# Patient Record
Sex: Male | Born: 1965 | Race: White | Hispanic: No | Marital: Married | Smoking: Current every day smoker
Health system: Southern US, Community
[De-identification: ages and names within clinical notes are randomized; demographics above are authoritative.]

## PROBLEM LIST (undated history)

## (undated) DIAGNOSIS — G4700 Insomnia, unspecified: Secondary | ICD-10-CM

## (undated) DIAGNOSIS — F419 Anxiety disorder, unspecified: Secondary | ICD-10-CM

## (undated) DIAGNOSIS — S3609XA Other injury of spleen, initial encounter: Secondary | ICD-10-CM

## (undated) DIAGNOSIS — R739 Hyperglycemia, unspecified: Secondary | ICD-10-CM

## (undated) DIAGNOSIS — F1910 Other psychoactive substance abuse, uncomplicated: Secondary | ICD-10-CM

## (undated) DIAGNOSIS — H9193 Unspecified hearing loss, bilateral: Secondary | ICD-10-CM

## (undated) DIAGNOSIS — I639 Cerebral infarction, unspecified: Secondary | ICD-10-CM

## (undated) DIAGNOSIS — T8859XA Other complications of anesthesia, initial encounter: Secondary | ICD-10-CM

## (undated) DIAGNOSIS — Z9289 Personal history of other medical treatment: Secondary | ICD-10-CM

## (undated) DIAGNOSIS — T85611A Breakdown (mechanical) of intraperitoneal dialysis catheter, initial encounter: Secondary | ICD-10-CM

## (undated) DIAGNOSIS — I10 Essential (primary) hypertension: Secondary | ICD-10-CM

## (undated) DIAGNOSIS — I739 Peripheral vascular disease, unspecified: Secondary | ICD-10-CM

## (undated) DIAGNOSIS — N189 Chronic kidney disease, unspecified: Secondary | ICD-10-CM

## (undated) DIAGNOSIS — T4145XA Adverse effect of unspecified anesthetic, initial encounter: Secondary | ICD-10-CM

## (undated) HISTORY — PX: ILIAC ARTERY STENT: SHX1786

## (undated) HISTORY — PX: EYE SURGERY: SHX253

## (undated) HISTORY — PX: TIBIALIS TENDON TRANSFER / REPAIR: SHX6630

## (undated) HISTORY — DX: Peripheral vascular disease, unspecified: I73.9

## (undated) HISTORY — DX: Hyperglycemia, unspecified: R73.9

## (undated) HISTORY — DX: Other psychoactive substance abuse, uncomplicated: F19.10

## (undated) HISTORY — DX: Cerebral infarction, unspecified: I63.9

## (undated) HISTORY — PX: EXTERIORIZATION OF A CONTINUOUS AMBULATORY PERITONEAL DIALYSIS CATHETER: SHX6382

## (undated) HISTORY — PX: HEMORROIDECTOMY: SUR656

## (undated) HISTORY — DX: Anxiety disorder, unspecified: F41.9

## (undated) HISTORY — PX: CHOLECYSTECTOMY: SHX55

## (undated) HISTORY — PX: OTHER SURGICAL HISTORY: SHX169

## (undated) HISTORY — DX: Breakdown (mechanical) of intraperitoneal dialysis catheter, initial encounter: T85.611A

## (undated) HISTORY — DX: Other injury of spleen, initial encounter: S36.09XA

## (undated) HISTORY — DX: Unspecified hearing loss, bilateral: H91.93

## (undated) HISTORY — DX: Insomnia, unspecified: G47.00

## (undated) HISTORY — DX: Chronic kidney disease, unspecified: N18.9

---

## 2008-04-10 ENCOUNTER — Encounter: Admission: RE | Admit: 2008-04-10 | Discharge: 2008-04-10 | Payer: Self-pay | Admitting: Neurology

## 2008-05-03 ENCOUNTER — Ambulatory Visit (HOSPITAL_COMMUNITY): Admission: RE | Admit: 2008-05-03 | Discharge: 2008-05-03 | Payer: Self-pay | Admitting: Neurology

## 2008-05-07 ENCOUNTER — Ambulatory Visit (HOSPITAL_COMMUNITY): Admission: RE | Admit: 2008-05-07 | Discharge: 2008-05-07 | Payer: Self-pay | Admitting: Neurology

## 2008-05-09 ENCOUNTER — Ambulatory Visit: Payer: Self-pay | Admitting: Vascular Surgery

## 2008-05-09 ENCOUNTER — Encounter (INDEPENDENT_AMBULATORY_CARE_PROVIDER_SITE_OTHER): Payer: Self-pay | Admitting: Interventional Radiology

## 2008-05-09 ENCOUNTER — Ambulatory Visit (HOSPITAL_COMMUNITY): Admission: RE | Admit: 2008-05-09 | Discharge: 2008-05-09 | Payer: Self-pay | Admitting: Interventional Radiology

## 2008-05-23 ENCOUNTER — Encounter: Payer: Self-pay | Admitting: Neurology

## 2008-05-28 ENCOUNTER — Ambulatory Visit (HOSPITAL_COMMUNITY): Admission: RE | Admit: 2008-05-28 | Discharge: 2008-05-28 | Payer: Self-pay | Admitting: Neurology

## 2009-02-26 ENCOUNTER — Encounter: Payer: Self-pay | Admitting: Interventional Radiology

## 2009-03-22 ENCOUNTER — Ambulatory Visit (HOSPITAL_COMMUNITY): Admission: RE | Admit: 2009-03-22 | Discharge: 2009-03-22 | Payer: Self-pay | Admitting: Interventional Radiology

## 2010-03-18 ENCOUNTER — Ambulatory Visit (HOSPITAL_COMMUNITY): Admission: RE | Admit: 2010-03-18 | Discharge: 2010-03-18 | Payer: Self-pay | Admitting: Oral Surgery

## 2010-10-05 ENCOUNTER — Encounter: Payer: Self-pay | Admitting: Interventional Radiology

## 2010-10-06 ENCOUNTER — Encounter: Payer: Self-pay | Admitting: Interventional Radiology

## 2010-11-30 LAB — BASIC METABOLIC PANEL
Calcium: 9.7 mg/dL (ref 8.4–10.5)
Glucose, Bld: 90 mg/dL (ref 70–99)
Potassium: 5.1 mEq/L (ref 3.5–5.1)
Sodium: 138 mEq/L (ref 135–145)

## 2010-11-30 LAB — CBC
Hemoglobin: 15.5 g/dL (ref 13.0–17.0)
RBC: 4.6 MIL/uL (ref 4.22–5.81)
WBC: 9.3 10*3/uL (ref 4.0–10.5)

## 2010-12-21 LAB — CBC
HCT: 41.3 % (ref 39.0–52.0)
Hemoglobin: 14.6 g/dL (ref 13.0–17.0)
MCHC: 35.4 g/dL (ref 30.0–36.0)
MCV: 93.8 fL (ref 78.0–100.0)
RBC: 4.4 MIL/uL (ref 4.22–5.81)
RDW: 13.6 % (ref 11.5–15.5)
WBC: 9.7 10*3/uL (ref 4.0–10.5)

## 2010-12-21 LAB — BASIC METABOLIC PANEL
BUN: 33 mg/dL — ABNORMAL HIGH (ref 6–23)
CO2: 19 mEq/L (ref 19–32)
Calcium: 9 mg/dL (ref 8.4–10.5)
Chloride: 109 mEq/L (ref 96–112)
Creatinine, Ser: 2.03 mg/dL — ABNORMAL HIGH (ref 0.4–1.5)
Glucose, Bld: 99 mg/dL (ref 70–99)
Potassium: 3.7 mEq/L (ref 3.5–5.1)

## 2010-12-21 LAB — PROTIME-INR: Prothrombin Time: 14.2 seconds (ref 11.6–15.2)

## 2011-01-27 NOTE — Consult Note (Signed)
NAMEELDIN, BONSELL               ACCOUNT NO.:  [id number]   MEDICAL RECORD NO.:  [id number]          PATIENT TYPE:  OUT   LOCATION:  XRAY                         FACILITY:  [facility]   PHYSICIAN:  Sanjeev K. Deveshwar, M.D.DATE OF BIRTH:  1965/09/21   DATE OF CONSULTATION:  05/09/2008  DATE OF DISCHARGE:                                 CONSULTATION   BRIEF HISTORY:  Mr. Clayton Porter is a 45 year old male who recently had a  cerebral angiogram performed by Dr. Corliss Skains on May 07, 2008 at the  request of Dr. 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  Dr. 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  The patient was also noted to have lower  extremity disease.  An angiogram of the lower extremities was also  performed.  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  He had an angiographically occluded right common  iliac artery, right external iliac artery, and right internal iliac  artery.  There was reconstitution of the right common femoral iliac  artery from extensive musculoskeletal collaterals arising from the left  common femoral and superficial femoral arteries.  Dr. Corliss Skains felt  that the patient would need a stent assisted angioplasty of the left  external iliac artery.  It was recommended to the patient to start  aspirin 325 mg daily.   We were contacted by the patient's wife yesterday stating that the  patient was having pain in his groin.  The angiogram was performed from  the left groin.  We asked the patient to come in to be evaluated.  The  patient presented with his wife today.  We checked Dopplers of the lower  extremities and found the dorsalis pedis pulses and posterior tibialis  pulses to be intact bilaterally, although the right dorsalis pedis was  slightly weaker than the others.  The patient's main pain  appeared to be  in his left groin area.  There were no signs of ecchymosis or signs of  inflammation to indicate any type of infection.  We did send the patient  to the Vascular Lab for an ultrasound to rule out a pseudoaneurysm of  the femoral artery.  The preliminary report was that the ultrasound was  negative for any aneurysm or pseudoaneurysm.  The patient was sent home.   The patient is a smoker.  Dr. Corliss Skains again encouraged him to quit  smoking.  Apparently, the patient had not started the aspirin as  instructed by Dr. Corliss Skains.  Once again, Dr. Corliss Skains reiterated the  importance of daily aspirin therapy.  We will arrange further followup  for this patient.  There is no charge for this visit today.      Delton See, P.A.    ______________________________  [place]ville Silos. Corliss Skains, M.D.    DR/MEDQ  D:  05/09/2008  T:  05/10/2008  Job:  [id number]

## 2011-06-17 LAB — CBC
HCT: 42.8
RDW: 13.7

## 2011-06-17 LAB — BASIC METABOLIC PANEL
BUN: 20
CO2: 23
Chloride: 109
Glucose, Bld: 91

## 2011-06-17 LAB — PROTIME-INR
INR: 1
Prothrombin Time: 13.8

## 2016-04-28 HISTORY — PX: PERITONEAL CATHETER INSERTION: SHX2223

## 2016-05-08 HISTORY — PX: SPLENECTOMY: SUR1306

## 2016-07-27 ENCOUNTER — Encounter: Payer: Self-pay | Admitting: Vascular Surgery

## 2016-07-27 ENCOUNTER — Other Ambulatory Visit: Payer: Self-pay | Admitting: *Deleted

## 2016-07-27 DIAGNOSIS — Z01810 Encounter for preprocedural cardiovascular examination: Secondary | ICD-10-CM

## 2016-07-27 DIAGNOSIS — N186 End stage renal disease: Secondary | ICD-10-CM

## 2016-08-12 ENCOUNTER — Encounter: Payer: Self-pay | Admitting: Vascular Surgery

## 2016-08-19 ENCOUNTER — Other Ambulatory Visit (HOSPITAL_COMMUNITY): Payer: Self-pay

## 2016-08-19 ENCOUNTER — Encounter: Payer: Self-pay | Admitting: Vascular Surgery

## 2016-08-19 ENCOUNTER — Encounter (HOSPITAL_COMMUNITY): Payer: Self-pay

## 2016-09-04 ENCOUNTER — Encounter: Payer: Self-pay | Admitting: Vascular Surgery

## 2016-09-18 ENCOUNTER — Ambulatory Visit (INDEPENDENT_AMBULATORY_CARE_PROVIDER_SITE_OTHER)
Admission: RE | Admit: 2016-09-18 | Discharge: 2016-09-18 | Disposition: A | Discharge status: Home / Self Care | Payer: Medicare Other | Source: Ambulatory Visit | Attending: Vascular Surgery | Admitting: Vascular Surgery

## 2016-09-18 ENCOUNTER — Other Ambulatory Visit: Payer: Self-pay

## 2016-09-18 ENCOUNTER — Encounter: Payer: Self-pay | Admitting: Vascular Surgery

## 2016-09-18 ENCOUNTER — Ambulatory Visit (HOSPITAL_COMMUNITY)
Admission: RE | Admit: 2016-09-18 | Discharge: 2016-09-18 | Disposition: A | Discharge status: Home / Self Care | Payer: Medicare Other | Source: Ambulatory Visit | Attending: Vascular Surgery | Admitting: Vascular Surgery

## 2016-09-18 ENCOUNTER — Ambulatory Visit (INDEPENDENT_AMBULATORY_CARE_PROVIDER_SITE_OTHER): Payer: Medicare Other | Admitting: Vascular Surgery

## 2016-09-18 VITALS — BP 136/98 | HR 80 | Resp 14 | Ht 68.0 in | Wt 146.0 lb

## 2016-09-18 DIAGNOSIS — Z992 Dependence on renal dialysis: Secondary | ICD-10-CM

## 2016-09-18 DIAGNOSIS — R9439 Abnormal result of other cardiovascular function study: Secondary | ICD-10-CM | POA: Diagnosis not present

## 2016-09-18 DIAGNOSIS — Z01810 Encounter for preprocedural cardiovascular examination: Secondary | ICD-10-CM

## 2016-09-18 DIAGNOSIS — N186 End stage renal disease: Secondary | ICD-10-CM

## 2016-09-18 NOTE — H&P (Signed)
Patient ID: Clayton Porter, male   DOB: 09-Jan-1966, 51 y.o.   MRN: [id number]  Reason for Consult: New Evaluation (eval for perm access)   Referred by Edrick Oh, MD  Subjective:     HPI:  Clayton Porter is a 51 y.o. male history of end-stage renal disease previously dialyzed via  peritoneal dialysis but he had complications with that. He now dialyzes via right internal jugular catheter. He presents today for evaluation of permanent hemodialysis access. He does not have history of upper extremity surgery or central catheterization other than his recent [facility] placement. He is on a have a personal or family history of DVT. Currently dialyzes on Tuesdays Thursdays and Saturdays. He is right-hand dominant.  Past Medical History:  Diagnosis Date  . Anxiety   . Chronic kidney disease    [place]  Tuesday, Thursday and Saturday  . Hearing loss of both ears    Wears hearing aids in both eary  . Hyperglycemia   . Insomnia   . Peripheral vascular disease ([place])   . Peritoneal dialysis catheter dysfunction ([place])   . Splenic rupture   . Stroke ([place])   . Substance abuse    smoker   No family history on file. Past Surgical History:  Procedure Laterality Date  . CHOLECYSTECTOMY    . EXTERIORIZATION OF A CONTINUOUS AMBULATORY PERITONEAL DIALYSIS CATHETER     Multiple times   . EYE SURGERY Right    Cataract removal with IOL  . HEMORROIDECTOMY    . ILIAC ARTERY STENT Left   . PERITONEAL CATHETER INSERTION  04/28/2016   Dr. Maryjean Morn in [facility]   . TIBIALIS TENDON TRANSFER / REPAIR Right     Short Social History:  Social History  Substance Use Topics  . Smoking status: Current Every Day Smoker    Types: Cigarettes  . Smokeless tobacco: Never Used  . Alcohol use No    No Known Allergies  Current Outpatient Prescriptions  Medication Sig Dispense Refill  . aspirin 325 MG tablet Take 325 mg by mouth daily.    . calcium acetate (PHOSLO) 667 MG capsule Take 667 mg by  mouth 3 (three) times daily with meals.    . Cholecalciferol (VITAMIN D3) 1000 units CAPS Take by mouth.    . clopidogrel (PLAVIX) 75 MG tablet Take 75 mg by mouth daily.    . diphenhydramine-acetaminophen (TYLENOL PM) 25-500 MG TABS tablet Take 1 tablet by mouth at bedtime as needed.    Marland Kitchen losartan (COZAAR) 100 MG tablet Take 100 mg by mouth daily.    . rosuvastatin (CRESTOR) 20 MG tablet Take 20 mg by mouth daily.    . sennosides-docusate sodium (SENOKOT-S) 8.6-50 MG tablet Take 1 tablet by mouth daily.    Marland Kitchen topiramate (TOPAMAX) 25 MG tablet Take 75 mg by mouth at bedtime.     No current facility-administered medications for this visit.     Review of Systems  Constitutional:  Constitutional negative. Eyes: Positive for visual disturbance.   Respiratory: Respiratory negative.  Cardiovascular: Cardiovascular negative.  GI: Gastrointestinal negative.  Musculoskeletal: Musculoskeletal negative.  Neurological: Neurological negative. Hematologic: Hematologic/lymphatic negative.  Psychiatric: Psychiatric negative.        Objective:  Objective   Vitals:   09/18/16 1439  BP: (!) 136/98  Pulse: 80  Resp: 14  SpO2: 100%  Weight: 146 lb (66.2 kg)  Height: '5\' 8"'$  (1.727 m)   Body mass index is 22.2 kg/m.  Physical Exam  Constitutional:  He is oriented to person, place, and time. He appears well-nourished.  HENT:  Head: Atraumatic.  Eyes: EOM are normal.  Neck: Normal range of motion.  Cardiovascular: Normal rate.   Pulses:      Radial pulses are 2+ on the left side.  Pulmonary/Chest: Effort normal.  Abdominal: Soft. He exhibits no mass.  Musculoskeletal: Normal range of motion. He exhibits no edema.  Neurological: He is oriented to person, place, and time.  Skin: Skin is warm and dry.  Psychiatric: He has a normal mood and affect. His behavior is normal. Judgment and thought content normal.    Data: Left brachial artery 0.45 cm with triphasic waveform. The radial artery at  the wrist 0.23 cm triphasic waveform.  Vein mapping demonstrates very little usable vein for fistula in the bilateral upper extremity.     Assessment/Plan:    51 year old male recently switched from peritoneal to hemodialysis currently on Tuesday Thursday Saturday schedule via right IJ tunneled dialysis catheter. He is now in need of permanent edema access. He does not have suitable vein and will need left upper extremity AV grafting.  We discussed risk benefits alternatives and he agrees to proceed. We will schedule him for this in the near future And he will Plavix for 5 days preoperatively.     Waynetta Sandy MD [facility]

## 2016-09-25 ENCOUNTER — Other Ambulatory Visit: Payer: Self-pay

## 2016-09-25 NOTE — Progress Notes (Signed)
I was unable to reach patient by phone.  I left  A message on voice mail.  I instructed the patient to arrive at [facility] entrance at 5:30 AM  , nothing to eat or drink after midnight.   I instructed the patient to take the following medications in the am with just enough water to get them down: Crestor if he takes it in am, Aspirin only if instructed by Dr Donzetta Matters. I asked patient to not wear any lotions, powders, cologne, jewelry, piercing, make-up or nail polish.  I asked the patient to call [phone], in the am if there were any questions or problems. I instructed patient that he needs a ride home and someone to stay with him for the first 24 hours after surgery.  I reminded patient that he was to have stopped Plavix 5 days prior to surgery.

## 2016-09-27 ENCOUNTER — Encounter (HOSPITAL_COMMUNITY): Payer: Self-pay | Admitting: Anesthesiology

## 2016-09-27 NOTE — Anesthesia Preprocedure Evaluation (Deleted)
Anesthesia Evaluation    Reviewed: Allergy & Precautions, Patient's Chart, lab work & pertinent test results  History of Anesthesia Complications Negative for: history of anesthetic complications  Airway        Dental   Pulmonary Current Smoker,           Cardiovascular + Peripheral Vascular Disease       Neuro/Psych Anxiety CVA    GI/Hepatic negative GI ROS, Neg liver ROS,   Endo/Other  negative endocrine ROS  Renal/GU ESRF and DialysisRenal disease     Musculoskeletal negative musculoskeletal ROS (+)   Abdominal   Peds  Hematology negative hematology ROS (+)   Anesthesia Other Findings Day of surgery medications reviewed with the patient.  Reproductive/Obstetrics                             Anesthesia Physical Anesthesia Plan  ASA: III  Anesthesia Plan: MAC   Post-op Pain Management:    Induction:   Airway Management Planned: Natural Airway and Simple Face Mask  Additional Equipment:   Intra-op Plan:   Post-operative Plan:   Informed Consent:   Plan Discussed with:   Anesthesia Plan Comments:         Anesthesia Quick Evaluation

## 2016-09-28 ENCOUNTER — Ambulatory Visit (HOSPITAL_COMMUNITY): Admission: RE | Admit: 2016-09-28 | Payer: Medicare Other | Source: Ambulatory Visit | Admitting: Vascular Surgery

## 2016-09-28 SURGERY — INSERTION OF ARTERIOVENOUS (AV) GORE-TEX GRAFT ARM
Anesthesia: Monitor Anesthesia Care | Laterality: Left

## 2016-09-28 MED ORDER — DEXTROSE 5 % IV SOLN: 1.5000 g | INTRAVENOUS | Status: DC

## 2016-09-28 MED ORDER — CHLORHEXIDINE GLUCONATE CLOTH 2 % EX PADS: 6.0000 | MEDICATED_PAD | Freq: Once | CUTANEOUS | Status: DC

## 2016-09-28 MED ORDER — PROPOFOL 1000 MG/100ML IV EMUL
INTRAVENOUS | Status: AC
  Filled 2016-09-28: qty 100

## 2016-09-28 MED ORDER — SODIUM CHLORIDE 0.9 % IV SOLN
INTRAVENOUS | Status: DC
  Administered 2016-10-12: 09:00:00 via INTRAVENOUS

## 2016-10-08 ENCOUNTER — Encounter: Payer: Self-pay | Admitting: Vascular Surgery

## 2016-10-08 ENCOUNTER — Other Ambulatory Visit (HOSPITAL_COMMUNITY): Payer: Self-pay

## 2016-10-08 ENCOUNTER — Encounter (HOSPITAL_COMMUNITY): Payer: Self-pay

## 2016-10-09 ENCOUNTER — Encounter (HOSPITAL_COMMUNITY): Payer: Self-pay | Admitting: *Deleted

## 2016-10-09 NOTE — Progress Notes (Addendum)
Patient is out of Plavix, wife is not sure when he had last dose.

## 2016-10-12 ENCOUNTER — Ambulatory Visit (HOSPITAL_COMMUNITY)
Admission: RE | Admit: 2016-10-12 | Discharge: 2016-10-12 | Disposition: A | Discharge status: Home / Self Care | Payer: Medicare Other | Source: Ambulatory Visit | Attending: Vascular Surgery | Admitting: Vascular Surgery

## 2016-10-12 ENCOUNTER — Encounter (HOSPITAL_COMMUNITY): Payer: Self-pay | Admitting: *Deleted

## 2016-10-12 ENCOUNTER — Encounter (HOSPITAL_COMMUNITY)
Admission: RE | Disposition: A | Discharge status: Home / Self Care | Payer: Self-pay | Source: Ambulatory Visit | Attending: Vascular Surgery

## 2016-10-12 ENCOUNTER — Other Ambulatory Visit: Payer: Self-pay | Admitting: *Deleted

## 2016-10-12 ENCOUNTER — Ambulatory Visit (HOSPITAL_COMMUNITY): Payer: Medicare Other | Admitting: Certified Registered"

## 2016-10-12 ENCOUNTER — Other Ambulatory Visit: Payer: Self-pay

## 2016-10-12 DIAGNOSIS — Z992 Dependence on renal dialysis: Secondary | ICD-10-CM | POA: Insufficient documentation

## 2016-10-12 DIAGNOSIS — N186 End stage renal disease: Secondary | ICD-10-CM

## 2016-10-12 DIAGNOSIS — I120 Hypertensive chronic kidney disease with stage 5 chronic kidney disease or end stage renal disease: Secondary | ICD-10-CM | POA: Insufficient documentation

## 2016-10-12 DIAGNOSIS — F17210 Nicotine dependence, cigarettes, uncomplicated: Secondary | ICD-10-CM | POA: Diagnosis not present

## 2016-10-12 DIAGNOSIS — Z7902 Long term (current) use of antithrombotics/antiplatelets: Secondary | ICD-10-CM | POA: Diagnosis not present

## 2016-10-12 DIAGNOSIS — N185 Chronic kidney disease, stage 5: Secondary | ICD-10-CM | POA: Diagnosis not present

## 2016-10-12 DIAGNOSIS — Z4931 Encounter for adequacy testing for hemodialysis: Secondary | ICD-10-CM

## 2016-10-12 DIAGNOSIS — Z7982 Long term (current) use of aspirin: Secondary | ICD-10-CM | POA: Diagnosis not present

## 2016-10-12 DIAGNOSIS — Z79899 Other long term (current) drug therapy: Secondary | ICD-10-CM | POA: Insufficient documentation

## 2016-10-12 DIAGNOSIS — Z8673 Personal history of transient ischemic attack (TIA), and cerebral infarction without residual deficits: Secondary | ICD-10-CM | POA: Insufficient documentation

## 2016-10-12 HISTORY — DX: Personal history of other medical treatment: Z92.89

## 2016-10-12 HISTORY — DX: Other complications of anesthesia, initial encounter: T88.59XA

## 2016-10-12 HISTORY — PX: AV FISTULA PLACEMENT: SHX1204

## 2016-10-12 HISTORY — DX: Essential (primary) hypertension: I10

## 2016-10-12 HISTORY — DX: Adverse effect of unspecified anesthetic, initial encounter: T41.45XA

## 2016-10-12 LAB — POCT I-STAT 4, (NA,K, GLUC, HGB,HCT)
Glucose, Bld: 82 mg/dL (ref 65–99)
HEMATOCRIT: 32 % — AB (ref 39.0–52.0)
HEMOGLOBIN: 10.9 g/dL — AB (ref 13.0–17.0)
POTASSIUM: 5.8 mmol/L — AB (ref 3.5–5.1)
SODIUM: 139 mmol/L (ref 135–145)

## 2016-10-12 SURGERY — ARTERIOVENOUS (AV) FISTULA CREATION
Anesthesia: Monitor Anesthesia Care | Site: Arm Upper | Laterality: Left

## 2016-10-12 MED ORDER — 0.9 % SODIUM CHLORIDE (POUR BTL) OPTIME
TOPICAL | Status: DC | PRN
  Administered 2016-10-12: 1000 mL

## 2016-10-12 MED ORDER — PROMETHAZINE HCL 25 MG/ML IJ SOLN: 6.2500 mg | INTRAMUSCULAR | Status: DC | PRN

## 2016-10-12 MED ORDER — PHENYLEPHRINE HCL 10 MG/ML IJ SOLN
INTRAVENOUS | Status: DC | PRN
  Administered 2016-10-12: 40 ug/min via INTRAVENOUS

## 2016-10-12 MED ORDER — LIDOCAINE 2% (20 MG/ML) 5 ML SYRINGE
INTRAMUSCULAR | Status: AC
  Filled 2016-10-12: qty 5

## 2016-10-12 MED ORDER — SODIUM POLYSTYRENE SULFONATE 15 GM/60ML PO SUSP
30.0000 g | Freq: Once | ORAL | Status: DC
  Filled 2016-10-12: qty 120

## 2016-10-12 MED ORDER — OXYCODONE-ACETAMINOPHEN 5-325 MG PO TABS: 1.0000 | ORAL_TABLET | Freq: Four times a day (QID) | ORAL | 0 refills | Status: AC | PRN

## 2016-10-12 MED ORDER — CEFAZOLIN SODIUM-DEXTROSE 2-4 GM/100ML-% IV SOLN
INTRAVENOUS | Status: AC
  Filled 2016-10-12: qty 100

## 2016-10-12 MED ORDER — FENTANYL CITRATE (PF) 100 MCG/2ML IJ SOLN: 25.0000 ug | INTRAMUSCULAR | Status: DC | PRN

## 2016-10-12 MED ORDER — LIDOCAINE-EPINEPHRINE (PF) 1 %-1:200000 IJ SOLN
INTRAMUSCULAR | Status: DC | PRN
Start: 2016-10-12 — End: 2016-10-12
  Administered 2016-10-12: 30 mL

## 2016-10-12 MED ORDER — EPHEDRINE 5 MG/ML INJ
INTRAVENOUS | Status: AC
  Filled 2016-10-12: qty 10

## 2016-10-12 MED ORDER — MIDAZOLAM HCL 2 MG/2ML IJ SOLN
INTRAMUSCULAR | Status: AC
  Filled 2016-10-12: qty 2

## 2016-10-12 MED ORDER — LIDOCAINE 2% (20 MG/ML) 5 ML SYRINGE
INTRAMUSCULAR | Status: DC | PRN
  Administered 2016-10-12: 60 mg via INTRAVENOUS

## 2016-10-12 MED ORDER — PROPOFOL 500 MG/50ML IV EMUL
INTRAVENOUS | Status: DC | PRN
  Administered 2016-10-12: 75 ug/kg/min via INTRAVENOUS

## 2016-10-12 MED ORDER — FENTANYL CITRATE (PF) 100 MCG/2ML IJ SOLN
INTRAMUSCULAR | Status: DC | PRN
  Administered 2016-10-12 (×2): 50 ug via INTRAVENOUS

## 2016-10-12 MED ORDER — LIDOCAINE-EPINEPHRINE (PF) 1 %-1:200000 IJ SOLN
INTRAMUSCULAR | Status: AC
  Filled 2016-10-12: qty 30

## 2016-10-12 MED ORDER — PROPOFOL 10 MG/ML IV BOLUS
INTRAVENOUS | Status: AC
  Filled 2016-10-12: qty 20

## 2016-10-12 MED ORDER — CEFAZOLIN SODIUM-DEXTROSE 2-4 GM/100ML-% IV SOLN
2.0000 g | Freq: Once | INTRAVENOUS | Status: AC
  Administered 2016-10-12: 2 g via INTRAVENOUS

## 2016-10-12 MED ORDER — FENTANYL CITRATE (PF) 100 MCG/2ML IJ SOLN
INTRAMUSCULAR | Status: AC
  Filled 2016-10-12: qty 2

## 2016-10-12 MED ORDER — HEPARIN SODIUM (PORCINE) 5000 UNIT/ML IJ SOLN
INTRAMUSCULAR | Status: DC | PRN
  Administered 2016-10-12: 10:00:00

## 2016-10-12 MED ORDER — PHENYLEPHRINE 40 MCG/ML (10ML) SYRINGE FOR IV PUSH (FOR BLOOD PRESSURE SUPPORT)
PREFILLED_SYRINGE | INTRAVENOUS | Status: DC | PRN
  Administered 2016-10-12: 80 ug via INTRAVENOUS

## 2016-10-12 MED ORDER — MIDAZOLAM HCL 5 MG/5ML IJ SOLN
INTRAMUSCULAR | Status: DC | PRN
  Administered 2016-10-12: 2 mg via INTRAVENOUS

## 2016-10-12 SURGICAL SUPPLY — 35 items
ARMBAND PINK RESTRICT EXTREMIT (MISCELLANEOUS) ×6 IMPLANT
CANISTER SUCTION 2500CC (MISCELLANEOUS) ×3 IMPLANT
CLIP TI MEDIUM 6 (CLIP) ×3 IMPLANT
CLIP TI WIDE RED SMALL 6 (CLIP) ×3 IMPLANT
COVER PROBE W GEL 5X96 (DRAPES) ×3 IMPLANT
DERMABOND ADVANCED (GAUZE/BANDAGES/DRESSINGS) ×1
DERMABOND ADVANCED .7 DNX12 (GAUZE/BANDAGES/DRESSINGS) ×2 IMPLANT
ELECT REM PT RETURN 9FT ADLT (ELECTROSURGICAL) ×3
ELECTRODE REM PT RTRN 9FT ADLT (ELECTROSURGICAL) ×2 IMPLANT
GLOVE BIO SURGEON STRL SZ 6.5 (GLOVE) ×3 IMPLANT
GLOVE BIO SURGEON STRL SZ7.5 (GLOVE) ×3 IMPLANT
GLOVE BIO SURGEON STRL SZ8.5 (GLOVE) ×3 IMPLANT
GLOVE BIOGEL PI IND STRL 7.0 (GLOVE) ×4 IMPLANT
GLOVE BIOGEL PI INDICATOR 7.0 (GLOVE) ×2
GLOVE ECLIPSE 6.5 STRL STRAW (GLOVE) ×3 IMPLANT
GOWN STRL REUS W/ TWL LRG LVL3 (GOWN DISPOSABLE) ×4 IMPLANT
GOWN STRL REUS W/ TWL XL LVL3 (GOWN DISPOSABLE) ×2 IMPLANT
GOWN STRL REUS W/TWL LRG LVL3 (GOWN DISPOSABLE) ×2
GOWN STRL REUS W/TWL XL LVL3 (GOWN DISPOSABLE) ×1
HEMOSTAT SNOW SURGICEL 2X4 (HEMOSTASIS) IMPLANT
INSERT FOGARTY SM (MISCELLANEOUS) ×3 IMPLANT
KIT BASIN OR (CUSTOM PROCEDURE TRAY) ×3 IMPLANT
KIT ROOM TURNOVER OR (KITS) ×3 IMPLANT
NS IRRIG 1000ML POUR BTL (IV SOLUTION) ×3 IMPLANT
PACK CV ACCESS (CUSTOM PROCEDURE TRAY) ×3 IMPLANT
PAD ARMBOARD 7.5X6 YLW CONV (MISCELLANEOUS) ×6 IMPLANT
SUT GORETEX 6.0 TH-9 30 IN (SUTURE) IMPLANT
SUT GORETEX CV-6TTC-13 36IN (SUTURE) IMPLANT
SUT MNCRL AB 4-0 PS2 18 (SUTURE) IMPLANT
SUT PROLENE 6 0 BV (SUTURE) IMPLANT
SUT SILK 2 0 SH (SUTURE) IMPLANT
SUT VIC AB 3-0 SH 27 (SUTURE) ×2
SUT VIC AB 3-0 SH 27X BRD (SUTURE) ×4 IMPLANT
UNDERPAD 30X30 (UNDERPADS AND DIAPERS) ×3 IMPLANT
WATER STERILE IRR 1000ML POUR (IV SOLUTION) ×3 IMPLANT

## 2016-10-12 NOTE — Op Note (Signed)
    OPERATIVE NOTE   PROCEDURE: Left brachiocephalic av fistula  PRE-OPERATIVE DIAGNOSIS: esrd  POST-OPERATIVE DIAGNOSIS: same  SURGEON: Yolanda Dockendorf C. Donzetta Matters, MD  ASSISTANT: Virgina Jock, PA  ANESTHESIA: local and MAC  ESTIMATED BLOOD LOSS: 20 cc  FINDING(S): Cephalic vein measure 16m in mid upper arm, at completion there was palpable pulsatility in runoff vein and palpable radial pulse   INDICATIONS:   Clayton SCHOENECKis a 51y.o. male who presents with end-stage renal disease. He previously had peritoneal needle dialysis and now has converted to hemodialysis via a right IJ tunneled dialysis catheter. Vein mapping demonstrated no usable vein for an upper extremity fistula so we had planned AV grafting. However at time of procedure ultrasound demonstrated suitable vein in the cephalic and basilic in the upper arm on the left returns of the choices may proceed with upper extremity AV fistula procedure.  DESCRIPTION: Patient was taken the operating room placed supine on the operating table Mac anesthesia was induced he was sterilely prepped and draped in the upper extremity on the left in the usual fashion and timeout called. We his ultrasound and identified some veins in his upper arm namely his cephalic vein being 3 mm. With this the wife was contacted and we changed the plan from grafting to upper extremity AV fistula which I have also spoken with the patient with prior but had not added to the consent. With this we instilled 8 mL of 1% lidocaine with epinephrine just below the antecubitum and made a transverse incision. We dissected down to the cephalic vein identified this has having a large external diameter. The branch was clipped we then transected the vein distally on the arm and dilated up to 3 mm in instilled heparinized saline and clamped the vein. We turned our attention towards the artery then. The fascia was divided the brachial artery was identified and circled with vessel  loop. The vein was then spatulated and prepared for anastomosis. The artery was clamped proximally and distally opened longitudinally and flushed with heparinized saline. The vein was then sewn end-to-side with 6-0 Prolene suture. Prior to releasing our clamps we did allow flushing maneuvers. Upon completing our anastomosis there was a palpable thrill with pulsatility in the runoff vein and a palpable radial pulse in the left. Satisfied with hemostasis we irrigated closed in 2 layers with Vicryl and Monocryl. Dermabond was placed level skin. Patient was awakened from anesthesia having tolerated the procedure well without immediate complication   CONDITION: stable   Clayton Porter C. CDonzetta Matters MD [facility]Office: [phone]: [phone] 10/12/2016, 10:43 AM

## 2016-10-12 NOTE — Anesthesia Postprocedure Evaluation (Signed)
Anesthesia Post Note  Patient: Clayton Porter  Procedure(s) Performed: Procedure(s) (LRB): ARTERIOVENOUS (AV) FISTULA CREATION LEFT UPPER ARM (Left)  Patient location during evaluation: PACU Anesthesia Type: MAC Level of consciousness: awake and alert Pain management: pain level controlled Vital Signs Assessment: post-procedure vital signs reviewed and stable Respiratory status: spontaneous breathing, nonlabored ventilation, respiratory function stable and patient connected to nasal cannula oxygen Cardiovascular status: stable and blood pressure returned to baseline Anesthetic complications: no       Last Vitals:  Vitals:   10/12/16 1110 10/12/16 1134  BP: (!) 121/91 (!) 137/95  Pulse: 86 93  Resp: (!) 23 18  Temp: 36.7 C     Last Pain:  Vitals:   10/12/16 1134  TempSrc:   PainSc: 0-No pain                 Tiajuana Amass

## 2016-10-12 NOTE — Transfer of Care (Signed)
Immediate Anesthesia Transfer of Care Note  Patient: Clayton Porter  Procedure(s) Performed: Procedure(s): ARTERIOVENOUS (AV) FISTULA CREATION LEFT UPPER ARM (Left)  Patient Location: PACU  Anesthesia Type:MAC  Level of Consciousness: awake, alert , oriented and patient cooperative  Airway & Oxygen Therapy: Patient Spontanous Breathing and Patient connected to nasal cannula oxygen  Post-op Assessment: Report given to RN, Post -op Vital signs reviewed and stable and Patient moving all extremities  Post vital signs: Reviewed and stable  Last Vitals:  Vitals:   10/12/16 0854  BP: (!) 160/94  Pulse: 88  Resp: 18  Temp: 37.1 C    Last Pain:  Vitals:   10/12/16 0854  TempSrc: Oral      Patients Stated Pain Goal: 5 (34/19/37 9024)  Complications: No apparent anesthesia complications

## 2016-10-12 NOTE — H&P (Signed)
     History and Physical Update  The patient was interviewed and re-examined.  The patient's previous History and Physical has been reviewed and is unchanged from office visit. Plan left arm av graft today.  Jaimey Franchini C. Donzetta Matters, MD [facility] [place] Office: [phone] Pager: [phone]   10/12/2016, 8:44 AM

## 2016-10-12 NOTE — Anesthesia Preprocedure Evaluation (Signed)
Anesthesia Evaluation  Patient identified by MRN, date of birth, ID band Patient awake    Reviewed: Allergy & Precautions, NPO status , Patient's Chart, lab work & pertinent test results  Airway Mallampati: II  TM Distance: >3 FB Neck ROM: Full    Dental  (+) Dental Advisory Given   Pulmonary Current Smoker,    breath sounds clear to auscultation       Cardiovascular hypertension, Pt. on medications  Rhythm:Regular Rate:Normal     Neuro/Psych CVA    GI/Hepatic negative GI ROS, Neg liver ROS,   Endo/Other  negative endocrine ROS  Renal/GU ESRFRenal disease     Musculoskeletal   Abdominal   Peds  Hematology negative hematology ROS (+)   Anesthesia Other Findings   Reproductive/Obstetrics                              Lab Results  Component Value Date   WBC 9.3 03/13/2010   HGB 15.5 03/13/2010   HCT 44.7 03/13/2010   MCV 97.1 03/13/2010   PLT 212 03/13/2010   Lab Results  Component Value Date   CREATININE 2.30 (H) 03/13/2010   BUN 26 (H) 03/13/2010   NA 138 03/13/2010   K 5.1 03/13/2010   CL 107 03/13/2010   CO2 22 03/13/2010    Anesthesia Physical Anesthesia Plan  ASA: III  Anesthesia Plan: MAC   Post-op Pain Management:    Induction: Intravenous  Airway Management Planned: Natural Airway and Simple Face Mask  Additional Equipment:   Intra-op Plan:   Post-operative Plan:   Informed Consent: I have reviewed the patients History and Physical, chart, labs and discussed the procedure including the risks, benefits and alternatives for the proposed anesthesia with the patient or authorized representative who has indicated his/her understanding and acceptance.   Dental advisory given  Plan Discussed with:   Anesthesia Plan Comments:         Anesthesia Quick Evaluation

## 2016-10-12 NOTE — OR Nursing (Signed)
Renal discipline notified of admitting potassium level. Orders recvd.

## 2016-10-13 ENCOUNTER — Telehealth: Payer: Self-pay

## 2016-10-13 ENCOUNTER — Encounter (HOSPITAL_COMMUNITY): Payer: Self-pay | Admitting: Vascular Surgery

## 2016-10-13 NOTE — Telephone Encounter (Signed)
Pt's wife called.  Reported the pt's left arm is swollen in incisional area, and around his elbow.  Stated "there is a little redness, but no warmth."  Stated he has been taking Percocet 5/325 mg 1 tab, for pain, and it isn't helping.  Discussed pt's symptoms with him; denied any pain or numbness of left hand or fingers.  Stated the arm is so painful around the elbow, it's hard to move it around.  Advised the swelling is not unusual.  Encouraged to elevate the arm on pillows, to position above level of heart, to manage the swelling.  The pt. stated he hasn't taken any Percocet today, because it didn't help.  Discussed with Dr. Donnetta Hutching.  Recommended to try taking 2 tablets of the Percocet 5/325 mg., to see if that controls the pain more.  The pt. stated he has 4 tabs left.  He will try taking 2 tablets per dose, and will call tomorrow to update on how he is doing.

## 2016-11-19 ENCOUNTER — Other Ambulatory Visit: Payer: Self-pay | Admitting: *Deleted

## 2016-11-20 ENCOUNTER — Encounter: Payer: Self-pay | Admitting: Vascular Surgery

## 2016-11-27 ENCOUNTER — Ambulatory Visit (INDEPENDENT_AMBULATORY_CARE_PROVIDER_SITE_OTHER): Payer: Self-pay | Admitting: Vascular Surgery

## 2016-11-27 ENCOUNTER — Encounter: Payer: Self-pay | Admitting: Vascular Surgery

## 2016-11-27 ENCOUNTER — Ambulatory Visit (HOSPITAL_COMMUNITY)
Admission: RE | Admit: 2016-11-27 | Discharge: 2016-11-27 | Disposition: A | Discharge status: Home / Self Care | Payer: Medicare Other | Source: Ambulatory Visit | Attending: Vascular Surgery | Admitting: Vascular Surgery

## 2016-11-27 VITALS — BP 134/81 | HR 99 | Temp 97.0°F | Resp 16 | Ht 68.0 in | Wt 151.0 lb

## 2016-11-27 DIAGNOSIS — N186 End stage renal disease: Secondary | ICD-10-CM

## 2016-11-27 DIAGNOSIS — Z992 Dependence on renal dialysis: Secondary | ICD-10-CM

## 2016-11-27 DIAGNOSIS — Z4931 Encounter for adequacy testing for hemodialysis: Secondary | ICD-10-CM | POA: Insufficient documentation

## 2016-11-27 NOTE — Progress Notes (Signed)
Subjective:     Patient ID: Clayton Porter, male   DOB: Dec 23, 1965, 51 y.o.   MRN: [id number]  HPI 51 year old male returns follow-up from left brachiocephalic AV fistula placement. He is doing well without any complaints. He is on dialysis via internal jugular catheter on the right. He has no complaints related to today's visit.  Review of Systems On hd via R IJ catheter    Objective:   Physical Exam aaox3 Left arm with palpable radial pulse, strong thrill in avf  Duplex of left arm fistula today demonstrates adequate size and depth. The flow volume is 1740 mL/min. There is one large branch vessel in the distal upper arm and one area of elevated velocities due to retained valve leaflet.     Assessment/plan     51 year old male returns for follow-up of left brachiocephalic AV fistula with strong thrill left hand is doing well with palpable radial pulse. His large diameter fistula although there is one large branch. He has good to start using this at 12 weeks from the time of placement. After then used for 3 times we can remove his catheter. He can otherwise follow up on a when necessary basis.  Klarissa Mcilvain C. Donzetta Matters, MD [facility] [place] Office: [phone] Pager: [phone]

## 2017-04-17 ENCOUNTER — Emergency Department (HOSPITAL_COMMUNITY): Payer: Medicare Other

## 2017-04-17 ENCOUNTER — Inpatient Hospital Stay (HOSPITAL_COMMUNITY)
Admission: EM | Admit: 2017-04-17 | Discharge: 2017-04-18 | DRG: 871 | Discharge status: Against Medical Advice | Payer: Medicare Other | Attending: Family Medicine | Admitting: Family Medicine

## 2017-04-17 ENCOUNTER — Encounter (HOSPITAL_COMMUNITY): Payer: Self-pay | Admitting: *Deleted

## 2017-04-17 DIAGNOSIS — Z992 Dependence on renal dialysis: Secondary | ICD-10-CM

## 2017-04-17 DIAGNOSIS — I120 Hypertensive chronic kidney disease with stage 5 chronic kidney disease or end stage renal disease: Secondary | ICD-10-CM | POA: Diagnosis present

## 2017-04-17 DIAGNOSIS — R918 Other nonspecific abnormal finding of lung field: Secondary | ICD-10-CM | POA: Diagnosis present

## 2017-04-17 DIAGNOSIS — Z79899 Other long term (current) drug therapy: Secondary | ICD-10-CM

## 2017-04-17 DIAGNOSIS — Z7902 Long term (current) use of antithrombotics/antiplatelets: Secondary | ICD-10-CM

## 2017-04-17 DIAGNOSIS — D72829 Elevated white blood cell count, unspecified: Secondary | ICD-10-CM | POA: Diagnosis present

## 2017-04-17 DIAGNOSIS — Z961 Presence of intraocular lens: Secondary | ICD-10-CM | POA: Diagnosis present

## 2017-04-17 DIAGNOSIS — K920 Hematemesis: Secondary | ICD-10-CM | POA: Diagnosis present

## 2017-04-17 DIAGNOSIS — N186 End stage renal disease: Secondary | ICD-10-CM | POA: Diagnosis present

## 2017-04-17 DIAGNOSIS — I10 Essential (primary) hypertension: Secondary | ICD-10-CM

## 2017-04-17 DIAGNOSIS — I739 Peripheral vascular disease, unspecified: Secondary | ICD-10-CM | POA: Diagnosis present

## 2017-04-17 DIAGNOSIS — Z974 Presence of external hearing-aid: Secondary | ICD-10-CM | POA: Diagnosis not present

## 2017-04-17 DIAGNOSIS — Y95 Nosocomial condition: Secondary | ICD-10-CM | POA: Diagnosis present

## 2017-04-17 DIAGNOSIS — F17210 Nicotine dependence, cigarettes, uncomplicated: Secondary | ICD-10-CM | POA: Diagnosis present

## 2017-04-17 DIAGNOSIS — R042 Hemoptysis: Secondary | ICD-10-CM | POA: Diagnosis present

## 2017-04-17 DIAGNOSIS — Z9049 Acquired absence of other specified parts of digestive tract: Secondary | ICD-10-CM

## 2017-04-17 DIAGNOSIS — A419 Sepsis, unspecified organism: Principal | ICD-10-CM | POA: Diagnosis present

## 2017-04-17 DIAGNOSIS — Z95828 Presence of other vascular implants and grafts: Secondary | ICD-10-CM | POA: Diagnosis not present

## 2017-04-17 DIAGNOSIS — E876 Hypokalemia: Secondary | ICD-10-CM | POA: Diagnosis present

## 2017-04-17 DIAGNOSIS — Z7982 Long term (current) use of aspirin: Secondary | ICD-10-CM

## 2017-04-17 DIAGNOSIS — D72825 Bandemia: Secondary | ICD-10-CM

## 2017-04-17 DIAGNOSIS — H9193 Unspecified hearing loss, bilateral: Secondary | ICD-10-CM | POA: Diagnosis present

## 2017-04-17 DIAGNOSIS — Z8673 Personal history of transient ischemic attack (TIA), and cerebral infarction without residual deficits: Secondary | ICD-10-CM | POA: Diagnosis not present

## 2017-04-17 DIAGNOSIS — Z9081 Acquired absence of spleen: Secondary | ICD-10-CM

## 2017-04-17 DIAGNOSIS — F419 Anxiety disorder, unspecified: Secondary | ICD-10-CM | POA: Diagnosis present

## 2017-04-17 DIAGNOSIS — R64 Cachexia: Secondary | ICD-10-CM | POA: Diagnosis present

## 2017-04-17 DIAGNOSIS — J189 Pneumonia, unspecified organism: Secondary | ICD-10-CM | POA: Diagnosis present

## 2017-04-17 DIAGNOSIS — D649 Anemia, unspecified: Secondary | ICD-10-CM | POA: Diagnosis not present

## 2017-04-17 DIAGNOSIS — Z6821 Body mass index (BMI) 21.0-21.9, adult: Secondary | ICD-10-CM

## 2017-04-17 LAB — CBC WITH DIFFERENTIAL/PLATELET
BASOS ABS: 0 10*3/uL (ref 0.0–0.1)
BASOS PCT: 0 %
EOS ABS: 0.4 10*3/uL (ref 0.0–0.7)
Eosinophils Relative: 2 %
HCT: 26.6 % — ABNORMAL LOW (ref 39.0–52.0)
Hemoglobin: 8.8 g/dL — ABNORMAL LOW (ref 13.0–17.0)
LYMPHS PCT: 16 %
Lymphs Abs: 3 10*3/uL (ref 0.7–4.0)
MCH: 31.8 pg (ref 26.0–34.0)
MCHC: 33.1 g/dL (ref 30.0–36.0)
MCV: 96 fL (ref 78.0–100.0)
Monocytes Absolute: 2.2 10*3/uL — ABNORMAL HIGH (ref 0.1–1.0)
Monocytes Relative: 12 %
NEUTROS PCT: 70 %
Neutro Abs: 13 10*3/uL — ABNORMAL HIGH (ref 1.7–7.7)
PLATELETS: 655 10*3/uL — AB (ref 150–400)
RBC: 2.77 MIL/uL — AB (ref 4.22–5.81)
RDW: 16 % — ABNORMAL HIGH (ref 11.5–15.5)
WBC: 18.6 10*3/uL — AB (ref 4.0–10.5)

## 2017-04-17 LAB — COMPREHENSIVE METABOLIC PANEL
ALBUMIN: 2.8 g/dL — AB (ref 3.5–5.0)
ALT: 18 U/L (ref 17–63)
ANION GAP: 13 (ref 5–15)
AST: 27 U/L (ref 15–41)
Alkaline Phosphatase: 93 U/L (ref 38–126)
BUN: 10 mg/dL (ref 6–20)
CHLORIDE: 90 mmol/L — AB (ref 101–111)
CO2: 30 mmol/L (ref 22–32)
Calcium: 8.4 mg/dL — ABNORMAL LOW (ref 8.9–10.3)
Creatinine, Ser: 2.78 mg/dL — ABNORMAL HIGH (ref 0.61–1.24)
GFR calc Af Amer: 29 mL/min — ABNORMAL LOW (ref >60)
GFR calc non Af Amer: 25 mL/min — ABNORMAL LOW (ref >60)
Glucose, Bld: 133 mg/dL — ABNORMAL HIGH (ref 65–99)
POTASSIUM: 3.4 mmol/L — AB (ref 3.5–5.1)
Sodium: 133 mmol/L — ABNORMAL LOW (ref 135–145)
Total Bilirubin: 0.5 mg/dL (ref 0.3–1.2)
Total Protein: 7.3 g/dL (ref 6.5–8.1)

## 2017-04-17 LAB — TYPE AND SCREEN
ABO/RH(D): O POS
ANTIBODY SCREEN: NEGATIVE

## 2017-04-17 LAB — PROTIME-INR
INR: 1.01
PROTHROMBIN TIME: 13.3 s (ref 11.4–15.2)

## 2017-04-17 LAB — URINALYSIS, MICROSCOPIC (REFLEX)
Bacteria, UA: NONE SEEN
Squamous Epithelial / LPF: NONE SEEN

## 2017-04-17 LAB — URINALYSIS, ROUTINE W REFLEX MICROSCOPIC
BILIRUBIN URINE: NEGATIVE
Glucose, UA: NEGATIVE mg/dL
KETONES UR: NEGATIVE mg/dL
LEUKOCYTES UA: NEGATIVE
NITRITE: NEGATIVE
PROTEIN: 30 mg/dL — AB
Specific Gravity, Urine: 1.01 (ref 1.005–1.030)
pH: 8 (ref 5.0–8.0)

## 2017-04-17 LAB — I-STAT CG4 LACTIC ACID, ED
LACTIC ACID, VENOUS: 2.67 mmol/L — AB (ref 0.5–1.9)
LACTIC ACID, VENOUS: 2.31 mmol/L — AB (ref 0.5–1.9)

## 2017-04-17 MED ORDER — CEFEPIME HCL 2 G IJ SOLR
2.0000 g | Freq: Once | INTRAMUSCULAR | Status: AC
  Administered 2017-04-17: 2 g via INTRAVENOUS
  Filled 2017-04-17: qty 2

## 2017-04-17 MED ORDER — MORPHINE SULFATE (PF) 4 MG/ML IV SOLN
4.0000 mg | Freq: Once | INTRAVENOUS | Status: AC
Start: 2017-04-17 — End: 2017-04-17
  Administered 2017-04-17: 4 mg via INTRAVENOUS
  Filled 2017-04-17: qty 1

## 2017-04-17 MED ORDER — SODIUM CHLORIDE 0.9 % IV BOLUS (SEPSIS)
1000.0000 mL | Freq: Once | INTRAVENOUS | Status: AC
  Administered 2017-04-17: 1000 mL via INTRAVENOUS

## 2017-04-17 MED ORDER — VANCOMYCIN HCL 10 G IV SOLR
1500.0000 mg | Freq: Once | INTRAVENOUS | Status: AC
  Administered 2017-04-17: 1500 mg via INTRAVENOUS
  Filled 2017-04-17: qty 1500

## 2017-04-17 MED ORDER — SODIUM CHLORIDE 0.9 % IV SOLN
80.0000 mg | Freq: Once | INTRAVENOUS | Status: DC
  Filled 2017-04-17: qty 80

## 2017-04-17 NOTE — ED Notes (Signed)
Pt request food and drink. MD notified and orders received

## 2017-04-17 NOTE — ED Notes (Signed)
Patient transported to CT 

## 2017-04-17 NOTE — ED Notes (Signed)
Consulting MD at bedside [facility]).

## 2017-04-17 NOTE — ED Triage Notes (Signed)
Pt reports coughing up blood for several days. Having abd pain and reports vomiting blood last night. Dialysis pt, last treatment was today.

## 2017-04-17 NOTE — H&P (Signed)
[name] ATF:[id number] DOB: 30-Jan-1966 DOA: 04/17/2017     PCP: Harvie Junior, MD   Outpatient Specialists: Nephrology   Patient coming from:   home Lives With family    Chief Complaint: Coughing up blood  HPI: Clayton Porter is a 51 y.o. male with medical history significant of ESRD sp brachiocephalic AV fistula placement, HTN, PVD, CVA status post splenectomy    Presented with recurrent hemoptysis for a few days. Reports coughing up about 1 teaspoon few times a day worse if he is hot. Reports no fever some chills. Denies shortness of breth at rest but gets winded with minimal exertion. No chest pain. He states have been smoking since 14 but have been exposed to second hand smoke prior to that. HE REPORTS LOOSING WEIGHT.  He was receiving regular dialysis today and mentioned it to the staff for last due to presented to emergency department. Patient is on Plavix for history of peripheral vascular disease and stroke. Reports 2 episodes of a followed by vomiting red blood after "gaging' he endorses epigastric pain. Denies alcohol abuse or drug abuse   Regarding pertinent Chronic problems: End-stage renal disease on hemodialysis Tuesday Thursday Saturday as hemodialysis was today. Hx of PVD sp stenting to his left LE 3 years ago. Reports remote stroke complicated by short term memory loss.    IN ER:  Temp (24hrs), Avg:99.4 F (37.4 C), Min:99.4 F (37.4 C), Max:99.4 F (37.4 C)      on arrival  ED Triage Vitals  Enc Vitals Group     BP 04/17/17 1813 106/67     Pulse Rate 04/17/17 1813 (!) 120     Resp 04/17/17 1813 (!) 22     Temp 04/17/17 1813 99.4 F (37.4 C)     Temp Source 04/17/17 1813 Oral     SpO2 04/17/17 1813 100 %     Weight --      Height --      Head Circumference --      Peak Flow --      Pain Score 04/17/17 1811 4     Pain Loc --      Pain Edu? --      Excl. in [place]? --   Satting 99% pulse 120 BP 99/58 Lactic acid 2.31 WBCs 18.6 hemoglobin 8.8  which is down from 10.22 September 2016 INR 1.01 NA 133 a 3.4 creatinine 2.78 CT chest perihilar 5 x 8 x 7.1 cm consolidation highly concerning for neoplasm with endobronchial invasion.   Following Medications were ordered in ER: Medications  pantoprazole (PROTONIX) 80 mg in sodium chloride 0.9 % 100 mL IVPB (not administered)  vancomycin (VANCOCIN) 1,500 mg in sodium chloride 0.9 % 500 mL IVPB (1,500 mg Intravenous New Bag/Given 04/17/17 2040)  sodium chloride 0.9 % bolus 1,000 mL (0 mLs Intravenous Stopped 04/17/17 2114)  ceFEPIme (MAXIPIME) 2 g in dextrose 5 % 50 mL IVPB (0 g Intravenous Stopped 04/17/17 2021)  morphine 4 MG/ML injection 4 mg (4 mg Intravenous Given 04/17/17 2114)     ER provider discussed case with: [facility] M who will see patient in the morning admit to medicine to this point  Hospitalist was called for admission for hemoptysis secondary to newly diagnosed left perihilar mass with endobronchial invasion while on Plavix possible sepsis  Review of Systems:    Pertinent positives include: Fevers, chills, fatigue, weight loss , hemoptysis  Constitutional:  No weight loss, night sweats,  HEENT:  No headaches,  Difficulty swallowing,Tooth/dental problems,Sore throat,  No sneezing, itching, ear ache, nasal congestion, post nasal drip,  Cardio-vascular:  No chest pain, Orthopnea, PND, anasarca, dizziness, palpitations.no Bilateral lower extremity swelling  GI:  No heartburn, indigestion, abdominal pain, nausea, vomiting, diarrhea, change in bowel habits, loss of appetite, melena, blood in stool, hematemesis Resp:  no shortness of breath at rest. No dyspnea on exertion, No excess mucus, no productive cough, No non-productive cough, No coughing up of blood.No change in color of mucus.No wheezing. Skin:  no rash or lesions. No jaundice GU:  no dysuria, change in color of urine, no urgency or frequency. No straining to urinate.  No flank pain.  Musculoskeletal:  No joint pain or no  joint swelling. No decreased range of motion. No back pain.  Psych:  No change in mood or affect. No depression or anxiety. No memory loss.  Neuro: no localizing neurological complaints, no tingling, no weakness, no double vision, no gait abnormality, no slurred speech, no confusion  As per HPI otherwise 10 point review of systems negative.   Past Medical History: Past Medical History:  Diagnosis Date  . Anxiety   . Chronic kidney disease    [place]  Tuesday, Thursday and Saturday  . Complication of anesthesia 2005 ish   woke up during ankle surgery  . Hearing loss of both ears    Wears hearing aids in both eary  . History of blood transfusion    "when spleen ruptured"  . Hyperglycemia   . Hypertension   . Insomnia   . Peripheral vascular disease ([place])   . Peritoneal dialysis catheter dysfunction ([place])   . Splenic rupture   . Stroke [facility])    3 stroke last one 2010- short term memory loss  . Substance abuse    smoker   Past Surgical History:  Procedure Laterality Date  . AV FISTULA PLACEMENT Left 10/12/2016   Procedure: ARTERIOVENOUS (AV) FISTULA CREATION LEFT UPPER ARM;  Surgeon: Waynetta Sandy, MD;  Location: [facility];  Service: Vascular;  Laterality: Left;  . CHOLECYSTECTOMY    . EXTERIORIZATION OF A CONTINUOUS AMBULATORY PERITONEAL DIALYSIS CATHETER     Multiple times   . EYE SURGERY Right    Cataract removal with IOL  . Hemodialysis catheter    . HEMORROIDECTOMY    . ILIAC ARTERY STENT Left   . PERITONEAL CATHETER INSERTION  04/28/2016   Dr. Maryjean Morn in [facility]   . SPLENECTOMY  05/08/2016  . TIBIALIS TENDON TRANSFER / REPAIR Right      Social History:  Ambulatory   independently      reports that he has been smoking Cigarettes.  He has been smoking about 0.25 packs per day. He has never used smokeless tobacco. He reports that he does not drink alcohol or use drugs.  Allergies:   Allergies  Allergen Reactions  . No Known Allergies      Family History:   Family History  Problem Relation Age of Onset  . Adopted: Yes    Medications: Prior to Admission medications   Medication Sig Start Date End Date Taking? Authorizing Provider  aspirin EC 81 MG tablet Take 81 mg by mouth every evening.   Yes [provider]  calcium acetate (PHOSLO) 667 MG capsule Take 667-1,334 mg by mouth See admin instructions. Take 2 capsules (1334 mg) by mouth three times daily with meals and 1 capsule (667 mg) with snacks   Yes [provider]  Cholecalciferol (VITAMIN D PO)  Take 500 Units by mouth 2 (two) times daily.    Yes [provider]  clopidogrel (PLAVIX) 75 MG tablet Take 75 mg by mouth every evening.    Yes [provider]  diphenhydramine-acetaminophen (TYLENOL PM) 25-500 MG TABS tablet Take 2 tablets by mouth at bedtime.    Yes [provider]  lidocaine-prilocaine (EMLA) cream Apply 1 application topically See admin instructions. Apply topically to access sites one hour before dialysis   Yes [provider]  losartan (COZAAR) 100 MG tablet Take 100 mg by mouth every evening.    Yes [provider]  rosuvastatin (CRESTOR) 20 MG tablet Take 20 mg by mouth every evening.    Yes [provider]  oxyCODONE-acetaminophen (PERCOCET/ROXICET) 5-325 MG tablet Take 1 tablet by mouth every 6 (six) hours as needed. Patient not taking: Reported on 11/27/2016 10/12/16   Alvia Grove, PA-C    Physical Exam: Patient Vitals for the past 24 hrs:  BP Temp Temp src Pulse Resp SpO2  04/17/17 2045 111/66 - - (!) 115 (!) 26 100 %  04/17/17 1945 99/64 - - (!) 117 13 99 %  04/17/17 1930 96/66 - - (!) 119 (!) 23 96 %  04/17/17 1815 95/62 - - 72 - 100 %  04/17/17 1813 106/67 99.4 F (37.4 C) Oral (!) 120 (!) 22 100 %    1. General:  in No Acute distress, thin appearing 2. Psychological: Alert and  Oriented 3. Head/ENT:    Dry Mucous Membranes                          Head Non  traumatic, neck supple                            Poor Dentition 4. SKIN:   decreased Skin turgor,  Skin clean Dry and intact no rash 5. Heart: Regular rate and rhythm no  Murmur, Rub or gallop 6. Lungs:   no wheezes some crackles   7. Abdomen: Soft,  non-tender, Non distended 8. Lower extremities: no clubbing, cyanosis, or edema 9. Neurologically Grossly intact, moving all 4 extremities equally   10. MSK: Normal range of motion   body mass index is unknown because there is no height or weight on file.  Labs on Admission:   Labs on Admission: I have personally reviewed following labs and imaging studies  CBC:  Recent Labs Lab 04/17/17 1820  WBC 18.6*  NEUTROABS 13.0*  HGB 8.8*  HCT 26.6*  MCV 96.0  PLT 062*   Basic Metabolic Panel:  Recent Labs Lab 04/17/17 1820  NA 133*  K 3.4*  CL 90*  CO2 30  GLUCOSE 133*  BUN 10  CREATININE 2.78*  CALCIUM 8.4*   GFR: CrCl cannot be calculated (Unknown ideal weight.). Liver Function Tests:  Recent Labs Lab 04/17/17 1820  AST 27  ALT 18  ALKPHOS 93  BILITOT 0.5  PROT 7.3  ALBUMIN 2.8*   No results for input(s): LIPASE, AMYLASE in the last 168 hours. No results for input(s): AMMONIA in the last 168 hours. Coagulation Profile:  Recent Labs Lab 04/17/17 1820  INR 1.01   Cardiac Enzymes: No results for input(s): CKTOTAL, CKMB, CKMBINDEX, TROPONINI in the last 168 hours. BNP (last 3 results) No results for input(s): PROBNP in the last 8760 hours. HbA1C: No results for input(s): HGBA1C in the last 72 hours. CBG: No results  for input(s): GLUCAP in the last 168 hours. Lipid Profile: No results for input(s): CHOL, HDL, LDLCALC, TRIG, CHOLHDL, LDLDIRECT in the last 72 hours. Thyroid Function Tests: No results for input(s): TSH, T4TOTAL, FREET4, T3FREE, THYROIDAB in the last 72 hours. Anemia Panel: No results for input(s): VITAMINB12, FOLATE, FERRITIN, TIBC, IRON, RETICCTPCT in the last 72 hours. Urine  analysis:    Component Value Date/Time   COLORURINE YELLOW 04/17/2017 1848   APPEARANCEUR CLEAR 04/17/2017 1848   LABSPEC 1.010 04/17/2017 1848   PHURINE 8.0 04/17/2017 1848   GLUCOSEU NEGATIVE 04/17/2017 1848   HGBUR TRACE (A) 04/17/2017 1848   BILIRUBINUR NEGATIVE 04/17/2017 1848   KETONESUR NEGATIVE 04/17/2017 1848   PROTEINUR 30 (A) 04/17/2017 1848   NITRITE NEGATIVE 04/17/2017 1848   LEUKOCYTESUR NEGATIVE 04/17/2017 1848   Sepsis Labs: @LABRCNTIP (procalcitonin:4,lacticidven:4) )No results found for this or any previous visit (from the past 240 hour(s)).     UA   no evidence of UTI     No results found for: HGBA1C  CrCl cannot be calculated (Unknown ideal weight.).  BNP (last 3 results) No results for input(s): PROBNP in the last 8760 hours.   ECG REPORT  Independently reviewed Rate:123  Rhythm: sinus tachycardia ST&T Change: No acute ischemic changes   QTC 471  There were no vitals filed for this visit.   Cultures: No results found for: SDES, [place], CULT, REPTSTATUS   Radiological Exams on Admission: Dg Chest 2 View  Result Date: 04/17/2017 CLINICAL DATA:  Pt reports coughing up blood for several days. Having abd pain and reports vomiting blood last night. Dialysis pt, last treatment was today. EXAM: CHEST  2 VIEW COMPARISON:  05/14/2016 FINDINGS: The heart size is normal. There is patchy density within the left lower lobe, partially obscuring the medial hemidiaphragm. No pulmonary edema. There is perihilar peribronchial thickening. IMPRESSION: Left lower lobe opacity. Consider further evaluation with CT of the chest for evaluation of hemoptysis. Intravenous contrast would be helpful unless it is contraindicated. Electronically Signed   By: Nolon Nations M.D.   On: 04/17/2017 18:42   Ct Chest Wo Contrast  Result Date: 04/17/2017 CLINICAL DATA:  Hemoptysis for several days. History of end-stage renal disease on dialysis, substance abuse, hypertension. EXAM:  CT CHEST WITHOUT CONTRAST TECHNIQUE: Multidetector CT imaging of the chest was performed following the standard protocol without IV contrast. COMPARISON:  CT chest May 13, 2016 and chest radiograph April 17, 2017 1831 hours FINDINGS: CARDIOVASCULAR: Heart size is normal. Trace pericardial effusion. Moderate coronary artery calcifications. Thoracic aorta is normal course and caliber with mild calcific atherosclerosis. MEDIASTINUM/NODES: No mediastinal mass. No lymphadenopathy by CT size criteria. Normal appearance of thoracic esophagus though not tailored for evaluation. LUNGS/PLEURA: 5.2 x 7.1 cm LEFT perihilar masslike consolidation narrowing the lingular bronchus and invading the LEFT lower lobe bronchus. Patchy consolidation with tree-in-bud infiltrates LEFT lower lobe. Small LEFT pleural effusion. Mild biapical bullous changes. UPPER ABDOMEN: 4.6 x 5.9 cm LEFT and 4.9 x 7.1 cm RIGHT new adrenal masses. Status post splenectomy. Stable 3.6 cm infrarenal aortic aneurysm. MUSCULOSKELETAL: Included soft tissues and included osseous structures appear normal. IMPRESSION: 1. LEFT perihilar 5 x 8 x 7.1 cm consolidation highly concerning for neoplasm with endobronchial invasion. Additional LEFT lower lobe tree-in-bud infiltrates can be seen with postobstructive pneumonia or, lymphangitic spread of tumor. 2. New bilateral adrenal masses highly concerning for metastasis. For constellation of findings, recommend PET-CT. 3. Re- demonstration 3.6 cm infrarenal aortic aneurysm. Acute findings discussed with and reconfirmed by  Dr.ERIN SCHLOSSMAN on 04/17/2017 at 8:43 pm. Aortic Atherosclerosis (ICD10-I70.0). Electronically Signed   By: Elon Alas M.D.   On: 04/17/2017 20:44    Chart has been reviewed    Assessment/Plan   51 y.o. male with medical history significant of ESRD sp brachiocephalic AV fistula placement, HTN, PVD, CVA status post splenectomy Admitted for hemoptysis and episodes of hematemesis secondary  to newly diagnosed lung mass also with evidence of sepsis  Present on Admission: . Hemoptysis - appreciate [facility] M consult likely secondary to lung mass, likely need a bronchoscopy, patient on Plavix soft blood pressure tachycardic but observe and step down for now, continuous pulse ox monitor for any evidence of respiratory distress . Lung mass - smoker suspect most likely lung cancer appreciate [facility] M help with  diagnosis . Leukocytosis - evaluate for possible infectious source . Anemia - will obtain anemia panel Hemoccult stool obtained serial CBC patient really endorses hemoptysis as well as hematemesis. Plavix transfuse as needed for hemoglobin below 7. . Hematemesis - 2 episodes denies history of melena unsure if patient have been swollen expectorate. Bloody sputum and then had a vomiting episode. Patient is on aspirin and Plavix. Also endorses epigastric pain. Would benefit from GI consult. Please call in a.m. Obtain serial CBC Protonix IV twice a day. Admit to step down given tachycardia and hypotension . ESRD (end stage renal disease) ([place]) - next hemodialysis on Tuesday please let nephrology note patient has been admitted if he still here, left msg on voice mail . Hypokalemia patient on HD, defer to nephrology  . Sepsis ([place]) - most likely source being pulmonary given the endobronchial lesion. Given patient on end-stage renal disease on dialysis continue broad-spectrum antibiotics cefepime and Vanco . PVD (peripheral vascular disease) ([facility]) hold Plavix and aspirin given hemoptysis and hematemesis Hypertension hold home medications given soft blood pressure    Other plan as per orders.  DVT prophylaxis:  SCD    Code Status:  FULL CODE e as per patient    Family Communication:   Family not  at  Bedside    Disposition Plan:     To home once workup is complete and patient is stable                     Consults called: PCCM aware    Admission status:     Inpatient   Level of care    tele          I have spent a total of 56 min on this admission   Faline Langer 04/18/2017, 1:08 AM   [facility]  Pager [phone]   after 2 AM please page floor coverage PA If 7AM-7PM, please contact the day team taking care of the patient  [email or web address]  Password [email or web address]

## 2017-04-17 NOTE — ED Provider Notes (Signed)
[facility] DEPT Provider Note   CSN: [id number] Arrival date & time: 04/17/17  1804     History   Chief Complaint Chief Complaint  Patient presents with  . Hemoptysis      HPI Clayton Porter is a 51 y.o. Male With history of ESRD on HD, HTN, PVD, CVA on plavix who presents with complaints of hemoptysis for the past several days. Patient reports that symptoms are worse when he is outside, described as coughing up sputum mixed with bright red blood. When he mentioned it to his dialysis nurses today, they told him to come to the ED.   Patient also reporting some hematemesis, however on further questioning it seems that he may have swallowed several episodes of coughing up bloody sputum which likely resulted perceived hematemesis.   Past Medical History:  Diagnosis Date  . Anxiety   . Chronic kidney disease    [place]  Tuesday, Thursday and Saturday  . Complication of anesthesia 2005 ish   woke up during ankle surgery  . Hearing loss of both ears    Wears hearing aids in both eary  . History of blood transfusion    "when spleen ruptured"  . Hyperglycemia   . Hypertension   . Insomnia   . Peripheral vascular disease ([place])   . Peritoneal dialysis catheter dysfunction ([place])   . Splenic rupture   . Stroke [facility])    3 stroke last one 2010- short term memory loss  . Substance abuse    smoker    Patient Active Problem List   Diagnosis Date Noted  . Hematemesis 04/18/2017  . Hemoptysis 04/17/2017  . Lung mass 04/17/2017  . Leukocytosis 04/17/2017  . Anemia 04/17/2017  . ESRD (end stage renal disease) ([place]) 04/17/2017  . Hypokalemia 04/17/2017  . Sepsis ([place]) 04/17/2017  . PVD (peripheral vascular disease) ([place]) 04/17/2017    Past Surgical History:  Procedure Laterality Date  . AV FISTULA PLACEMENT Left 10/12/2016   Procedure: ARTERIOVENOUS (AV) FISTULA CREATION LEFT UPPER ARM;  Surgeon: Waynetta Sandy, MD;  Location: [facility];  Service:  Vascular;  Laterality: Left;  . CHOLECYSTECTOMY    . EXTERIORIZATION OF A CONTINUOUS AMBULATORY PERITONEAL DIALYSIS CATHETER     Multiple times   . EYE SURGERY Right    Cataract removal with IOL  . Hemodialysis catheter    . HEMORROIDECTOMY    . ILIAC ARTERY STENT Left   . PERITONEAL CATHETER INSERTION  04/28/2016   Dr. Maryjean Morn in [facility]   . SPLENECTOMY  05/08/2016  . TIBIALIS TENDON TRANSFER / REPAIR Right        Home Medications    Prior to Admission medications   Medication Sig Start Date End Date Taking? Authorizing Provider  aspirin EC 81 MG tablet Take 81 mg by mouth every evening.   Yes [provider]  calcium acetate (PHOSLO) 667 MG capsule Take 667-1,334 mg by mouth See admin instructions. Take 2 capsules (1334 mg) by mouth three times daily with meals and 1 capsule (667 mg) with snacks   Yes [provider]  Cholecalciferol (VITAMIN D PO) Take 500 Units by mouth 2 (two) times daily.    Yes [provider]  clopidogrel (PLAVIX) 75 MG tablet Take 75 mg by mouth every evening.    Yes [provider]  diphenhydramine-acetaminophen (TYLENOL PM) 25-500 MG TABS tablet Take 2 tablets by mouth at bedtime.    Yes [provider]  lidocaine-prilocaine (EMLA) cream Apply 1  application topically See admin instructions. Apply topically to access sites one hour before dialysis   Yes [provider]  losartan (COZAAR) 100 MG tablet Take 100 mg by mouth every evening.    Yes [provider]  rosuvastatin (CRESTOR) 20 MG tablet Take 20 mg by mouth every evening.    Yes [provider]  oxyCODONE-acetaminophen (PERCOCET/ROXICET) 5-325 MG tablet Take 1 tablet by mouth every 6 (six) hours as needed. Patient not taking: Reported on 11/27/2016 10/12/16   Alvia Grove PA-C    Family History Family History  Problem Relation Age of Onset  . Adopted: Yes    Social History Social History  Substance Use Topics  .  Smoking status: Current Every Day Smoker    Packs/day: 0.25    Types: Cigarettes  . Smokeless tobacco: Never Used  . Alcohol use No     Allergies   No known allergies   Review of Systems Review of Systems  Constitutional: Negative for chills and fever.  HENT: Negative for ear pain and sore throat.   Eyes: Negative for pain and visual disturbance.  Respiratory: Positive for cough. Negative for shortness of breath.   Cardiovascular: Negative for chest pain and palpitations.  Gastrointestinal: Positive for vomiting. Negative for abdominal pain, anal bleeding and blood in stool.       Hemoptysis  Genitourinary: Negative for dysuria and hematuria.  Musculoskeletal: Negative for arthralgias and back pain.  Skin: Negative for color change and rash.  Neurological: Negative for seizures and syncope.  All other systems reviewed and are negative.    Physical Exam Updated Vital Signs BP 100/68   Pulse 99   Temp 99.4 F (37.4 C) (Oral)   Resp (!) 26   Ht 5\' 8"  (1.727 m)   Wt 64.3 kg (141 lb 12.1 oz)   SpO2 100%   BMI 21.55 kg/m   Physical Exam  Constitutional: He appears well-developed.  Cachectic   HENT:  Head: Normocephalic and atraumatic.  Eyes: Conjunctivae are normal.  Neck: Neck supple.  Cardiovascular: Normal rate and regular rhythm.   No murmur heard. Pulmonary/Chest: Effort normal and breath sounds normal. No respiratory distress.  Abdominal: Soft. There is no tenderness.  Musculoskeletal: He exhibits no edema.  Neurological: He is alert.  Skin: Skin is warm and dry.  Psychiatric: He has a normal mood and affect.  Nursing note and vitals reviewed.    ED Treatments / Results  Labs (all labs ordered are listed, but only abnormal results are displayed) Labs Reviewed  COMPREHENSIVE METABOLIC PANEL - Abnormal; Notable for the following:       Result Value   Sodium 133 (*)    Potassium 3.4 (*)    Chloride 90 (*)    Glucose, Bld 133 (*)    Creatinine, Ser  2.78 (*)    Calcium 8.4 (*)    Albumin 2.8 (*)    GFR calc non Af Amer 25 (*)    GFR calc Af Amer 29 (*)    All other components within normal limits  CBC WITH DIFFERENTIAL/PLATELET - Abnormal; Notable for the following:    WBC 18.6 (*)    RBC 2.77 (*)    Hemoglobin 8.8 (*)    HCT 26.6 (*)    RDW 16.0 (*)    Platelets 655 (*)    Neutro Abs 13.0 (*)    Monocytes Absolute 2.2 (*)    All other components within normal limits  URINALYSIS, ROUTINE W REFLEX MICROSCOPIC -  Abnormal; Notable for the following:    Hgb urine dipstick TRACE (*)    Protein, ur 30 (*)    All other components within normal limits  IRON AND TIBC - Abnormal; Notable for the following:    Iron 22 (*)    TIBC 113 (*)    All other components within normal limits  FERRITIN - Abnormal; Notable for the following:    Ferritin 1,337 (*)    All other components within normal limits  RETICULOCYTES - Abnormal; Notable for the following:    Retic Ct Pct 3.2 (*)    RBC. 2.55 (*)    All other components within normal limits  CBC WITH DIFFERENTIAL/PLATELET - Abnormal; Notable for the following:    Neutro Abs 13.5 (*)    Monocytes Absolute 1.9 (*)    All other components within normal limits  COMPREHENSIVE METABOLIC PANEL - Abnormal; Notable for the following:    Sodium 134 (*)    Chloride 98 (*)    Glucose, Bld 121 (*)    Creatinine, Ser 3.68 (*)    Calcium 8.4 (*)    Albumin 2.4 (*)    ALT 14 (*)    GFR calc non Af Amer 18 (*)    GFR calc Af Amer 20 (*)    All other components within normal limits  TSH - Abnormal; Notable for the following:    TSH 4.581 (*)    All other components within normal limits  COMPREHENSIVE METABOLIC PANEL - Abnormal; Notable for the following:    Sodium 134 (*)    Chloride 97 (*)    Glucose, Bld 120 (*)    Creatinine, Ser 3.65 (*)    Calcium 8.5 (*)    Albumin 2.4 (*)    ALT 13 (*)    GFR calc non Af Amer 18 (*)    GFR calc Af Amer 21 (*)    All other components within normal  limits  CBC - Abnormal; Notable for the following:    WBC 18.9 (*)    RBC 2.55 (*)    Hemoglobin 7.8 (*)    HCT 24.6 (*)    RDW 16.0 (*)    Platelets 576 (*)    All other components within normal limits  I-STAT CG4 LACTIC ACID, ED - Abnormal; Notable for the following:    Lactic Acid, Venous 2.67 (*)    All other components within normal limits  I-STAT CG4 LACTIC ACID, ED - Abnormal; Notable for the following:    Lactic Acid, Venous 2.31 (*)    All other components within normal limits  CULTURE, BLOOD (ROUTINE X 2)  CULTURE, BLOOD (ROUTINE X 2)  CULTURE, EXPECTORATED SPUTUM-ASSESSMENT  GRAM STAIN  PROTIME-INR  URINALYSIS, MICROSCOPIC (REFLEX)  HIV ANTIBODY (ROUTINE TESTING)  VITAMIN B12  FOLATE  LACTIC ACID, PLASMA  PROCALCITONIN  MAGNESIUM  PHOSPHORUS  TYPE AND SCREEN  ABO/RH    EKG  EKG Interpretation  Date/Time:  Saturday April 17 2017 18:51:19 EDT Ventricular Rate:  123 PR Interval:    QRS Duration: 90 QT Interval:  329 QTC Calculation: 471 R Axis:   64 Text Interpretation:  Sinus tachycardia Confirmed by Pryor Curia [phone]) on 04/18/2017 10:07:18 AM       Radiology Dg Chest 2 View  Result Date: 04/17/2017 CLINICAL DATA:  Pt reports coughing up blood for several days. Having abd pain and reports vomiting blood last night. Dialysis pt, last treatment was today. EXAM: CHEST  2 VIEW COMPARISON:  05/14/2016  FINDINGS: The heart size is normal. There is patchy density within the left lower lobe, partially obscuring the medial hemidiaphragm. No pulmonary edema. There is perihilar peribronchial thickening. IMPRESSION: Left lower lobe opacity. Consider further evaluation with CT of the chest for evaluation of hemoptysis. Intravenous contrast would be helpful unless it is contraindicated. Electronically Signed   By: Nolon Nations M.D.   On: 04/17/2017 18:42   Ct Chest Wo Contrast  Result Date: 04/17/2017 CLINICAL DATA:  Hemoptysis for several days. History of  end-stage renal disease on dialysis, substance abuse, hypertension. EXAM: CT CHEST WITHOUT CONTRAST TECHNIQUE: Multidetector CT imaging of the chest was performed following the standard protocol without IV contrast. COMPARISON:  CT chest May 13, 2016 and chest radiograph April 17, 2017 1831 hours FINDINGS: CARDIOVASCULAR: Heart size is normal. Trace pericardial effusion. Moderate coronary artery calcifications. Thoracic aorta is normal course and caliber with mild calcific atherosclerosis. MEDIASTINUM/NODES: No mediastinal mass. No lymphadenopathy by CT size criteria. Normal appearance of thoracic esophagus though not tailored for evaluation. LUNGS/PLEURA: 5.2 x 7.1 cm LEFT perihilar masslike consolidation narrowing the lingular bronchus and invading the LEFT lower lobe bronchus. Patchy consolidation with tree-in-bud infiltrates LEFT lower lobe. Small LEFT pleural effusion. Mild biapical bullous changes. UPPER ABDOMEN: 4.6 x 5.9 cm LEFT and 4.9 x 7.1 cm RIGHT new adrenal masses. Status post splenectomy. Stable 3.6 cm infrarenal aortic aneurysm. MUSCULOSKELETAL: Included soft tissues and included osseous structures appear normal. IMPRESSION: 1. LEFT perihilar 5 x 8 x 7.1 cm consolidation highly concerning for neoplasm with endobronchial invasion. Additional LEFT lower lobe tree-in-bud infiltrates can be seen with postobstructive pneumonia or, lymphangitic spread of tumor. 2. New bilateral adrenal masses highly concerning for metastasis. For constellation of findings, recommend PET-CT. 3. Re- demonstration 3.6 cm infrarenal aortic aneurysm. Acute findings discussed with and reconfirmed by Dr.ERIN SCHLOSSMAN on 04/17/2017 at 8:43 pm. Aortic Atherosclerosis (ICD10-I70.0). Electronically Signed   By: Elon Alas M.D.   On: 04/17/2017 20:44    Procedures Procedures (including critical care time)  Medications Ordered in ED Medications  sodium chloride 0.9 % bolus 1,000 mL (0 mLs Intravenous Stopped 04/17/17  2114)  vancomycin (VANCOCIN) 1,500 mg in sodium chloride 0.9 % 500 mL IVPB (0 mg Intravenous Stopped 04/17/17 2240)  ceFEPIme (MAXIPIME) 2 g in dextrose 5 % 50 mL IVPB (0 g Intravenous Stopped 04/17/17 2021)  morphine 4 MG/ML injection 4 mg (4 mg Intravenous Given 04/17/17 2114)     Initial Impression / Assessment and Plan / ED Course  I have reviewed the triage vital signs and the nursing notes.  Pertinent labs & imaging results that were available during my care of the patient were reviewed by me and considered in my medical decision making (see chart for details).    Patient is a 51 year old male with history of ESRD on HD, HTN, PVD, CVA on plavix who presents with hemoptysis. Patient arrived HDS in no acute distress. Exam as above, cachectic appearing male.   Initial workup significant for leukocytosis and elevated lactate, CXR showing LLL opacity. Empiric antibiotics with vancomycin and cefepime started for HAP.   CT chest obtained which unfortunately showed  left consolidation highly concerning for neoplasm with endobronchial invasion, possible post obstructive pneumonia, bilateral adrenal masses highly concerning for metastasis. Discussed results with patient and his wife at bedside. Discussed with hospitalist for admission for further workup for likely metastatic lung cancer, in addition to continued treatment for HAP. Consulted Pulmonary for anticipated bronchoscopy needs. Patient and his wife in agreement  with plan at time of admission.  Patient and plan of care discussed with Attending physician, Dr. Billy Fischer.     Final Clinical Impressions(s) / ED Diagnoses   Final diagnoses:  Hemoptysis  Hospital-acquired pneumonia  Lung mass  Sepsis, due to unspecified organism [facility])    New Prescriptions Discharge Medication List as of 04/18/2017  7:14 AM       Arnetha Massy, MD 04/19/17 [id number]    Gareth Morgan, MD 04/24/17 1423

## 2017-04-17 NOTE — ED Notes (Signed)
Transported to xray from triage.

## 2017-04-17 NOTE — ED Notes (Signed)
Resident MD at bedside with RN and stated that Protonix was not needed at current time

## 2017-04-17 NOTE — ED Notes (Signed)
Pt has arrived to room D35

## 2017-04-17 NOTE — ED Notes (Signed)
Pt reports he goes to dialysis T/Th/Sat and was last dialyzed today in which he received the full dialysis consisting of 3.5 hours.

## 2017-04-18 DIAGNOSIS — D631 Anemia in chronic kidney disease: Secondary | ICD-10-CM

## 2017-04-18 DIAGNOSIS — R918 Other nonspecific abnormal finding of lung field: Secondary | ICD-10-CM | POA: Diagnosis not present

## 2017-04-18 DIAGNOSIS — R042 Hemoptysis: Secondary | ICD-10-CM | POA: Diagnosis not present

## 2017-04-18 DIAGNOSIS — A419 Sepsis, unspecified organism: Secondary | ICD-10-CM | POA: Diagnosis not present

## 2017-04-18 DIAGNOSIS — Z992 Dependence on renal dialysis: Secondary | ICD-10-CM

## 2017-04-18 DIAGNOSIS — N186 End stage renal disease: Secondary | ICD-10-CM | POA: Diagnosis not present

## 2017-04-18 DIAGNOSIS — K920 Hematemesis: Secondary | ICD-10-CM | POA: Diagnosis present

## 2017-04-18 LAB — CBC WITH DIFFERENTIAL/PLATELET
BASOS ABS: 0 10*3/uL (ref 0.0–0.1)
BASOS PCT: 0 %
Eosinophils Absolute: 0.3 10*3/uL (ref 0.0–0.7)
Eosinophils Relative: 2 %
LYMPHS PCT: 18 %
Lymphs Abs: 3.5 10*3/uL (ref 0.7–4.0)
MONOS PCT: 10 %
Monocytes Absolute: 1.9 10*3/uL — ABNORMAL HIGH (ref 0.1–1.0)
NEUTROS PCT: 70 %
Neutro Abs: 13.5 10*3/uL — ABNORMAL HIGH (ref 1.7–7.7)

## 2017-04-18 LAB — COMPREHENSIVE METABOLIC PANEL
ALBUMIN: 2.4 g/dL — AB (ref 3.5–5.0)
ALT: 13 U/L — AB (ref 17–63)
ALT: 14 U/L — AB (ref 17–63)
AST: 18 U/L (ref 15–41)
AST: 18 U/L (ref 15–41)
Albumin: 2.4 g/dL — ABNORMAL LOW (ref 3.5–5.0)
Alkaline Phosphatase: 55 U/L (ref 38–126)
Alkaline Phosphatase: 60 U/L (ref 38–126)
Anion gap: 10 (ref 5–15)
Anion gap: 11 (ref 5–15)
BUN: 16 mg/dL (ref 6–20)
BUN: 17 mg/dL (ref 6–20)
CHLORIDE: 98 mmol/L — AB (ref 101–111)
CO2: 26 mmol/L (ref 22–32)
CO2: 26 mmol/L (ref 22–32)
CREATININE: 3.65 mg/dL — AB (ref 0.61–1.24)
CREATININE: 3.68 mg/dL — AB (ref 0.61–1.24)
Calcium: 8.4 mg/dL — ABNORMAL LOW (ref 8.9–10.3)
Calcium: 8.5 mg/dL — ABNORMAL LOW (ref 8.9–10.3)
Chloride: 97 mmol/L — ABNORMAL LOW (ref 101–111)
GFR calc Af Amer: 20 mL/min — ABNORMAL LOW (ref >60)
GFR calc Af Amer: 21 mL/min — ABNORMAL LOW (ref >60)
GFR, EST NON AFRICAN AMERICAN: 18 mL/min — AB (ref >60)
GFR, EST NON AFRICAN AMERICAN: 18 mL/min — AB (ref >60)
GLUCOSE: 120 mg/dL — AB (ref 65–99)
GLUCOSE: 121 mg/dL — AB (ref 65–99)
POTASSIUM: 3.7 mmol/L (ref 3.5–5.1)
POTASSIUM: 3.8 mmol/L (ref 3.5–5.1)
Sodium: 134 mmol/L — ABNORMAL LOW (ref 135–145)
Sodium: 134 mmol/L — ABNORMAL LOW (ref 135–145)
Total Bilirubin: 0.3 mg/dL (ref 0.3–1.2)
Total Bilirubin: 0.4 mg/dL (ref 0.3–1.2)
Total Protein: 6.6 g/dL (ref 6.5–8.1)
Total Protein: 6.5 g/dL (ref 6.5–8.1)

## 2017-04-18 LAB — PHOSPHORUS: PHOSPHORUS: 3.4 mg/dL (ref 2.5–4.6)

## 2017-04-18 LAB — FOLATE: Folate: 12.2 ng/mL (ref >5.9)

## 2017-04-18 LAB — CBC
HEMATOCRIT: 24.6 % — AB (ref 39.0–52.0)
Hemoglobin: 7.8 g/dL — ABNORMAL LOW (ref 13.0–17.0)
MCH: 30.6 pg (ref 26.0–34.0)
MCHC: 31.7 g/dL (ref 30.0–36.0)
MCV: 96.5 fL (ref 78.0–100.0)
PLATELETS: 576 10*3/uL — AB (ref 150–400)
RBC: 2.55 MIL/uL — ABNORMAL LOW (ref 4.22–5.81)
RDW: 16 % — AB (ref 11.5–15.5)
WBC: 18.9 10*3/uL — AB (ref 4.0–10.5)

## 2017-04-18 LAB — LACTIC ACID, PLASMA: Lactic Acid, Venous: 1.3 mmol/L (ref 0.5–1.9)

## 2017-04-18 LAB — HIV ANTIBODY (ROUTINE TESTING W REFLEX): HIV Screen 4th Generation wRfx: NONREACTIVE

## 2017-04-18 LAB — RETICULOCYTES
RBC.: 2.55 MIL/uL — AB (ref 4.22–5.81)
RETIC CT PCT: 3.2 % — AB (ref 0.4–3.1)
Retic Count, Absolute: 81.6 10*3/uL (ref 19.0–186.0)

## 2017-04-18 LAB — PROCALCITONIN: Procalcitonin: 0.44 ng/mL

## 2017-04-18 LAB — FERRITIN: FERRITIN: 1337 ng/mL — AB (ref 24–336)

## 2017-04-18 LAB — IRON AND TIBC
Iron: 22 ug/dL — ABNORMAL LOW (ref 45–182)
SATURATION RATIOS: 19 % (ref 17.9–39.5)
TIBC: 113 ug/dL — AB (ref 250–450)
UIBC: 91 ug/dL

## 2017-04-18 LAB — ABO/RH: ABO/RH(D): O POS

## 2017-04-18 LAB — TSH: TSH: 4.581 u[IU]/mL — AB (ref 0.350–4.500)

## 2017-04-18 LAB — MAGNESIUM: Magnesium: 2.1 mg/dL (ref 1.7–2.4)

## 2017-04-18 LAB — VITAMIN B12: Vitamin B-12: 694 pg/mL (ref 180–914)

## 2017-04-18 MED ORDER — DEXTROSE 5 % IV SOLN: 2.0000 g | INTRAVENOUS | Status: DC

## 2017-04-18 MED ORDER — NICOTINE 21 MG/24HR TD PT24: 21.0000 mg | MEDICATED_PATCH | Freq: Every day | TRANSDERMAL | Status: DC

## 2017-04-18 MED ORDER — SODIUM CHLORIDE 0.9 % IV SOLN: 250.0000 mL | INTRAVENOUS | Status: DC | PRN

## 2017-04-18 MED ORDER — CALCIUM ACETATE (PHOS BINDER) 667 MG PO CAPS: 667.0000 mg | ORAL_CAPSULE | ORAL | Status: DC | PRN

## 2017-04-18 MED ORDER — GUAIFENESIN ER 600 MG PO TB12
600.0000 mg | ORAL_TABLET | Freq: Two times a day (BID) | ORAL | Status: DC
  Administered 2017-04-18: 600 mg via ORAL
  Filled 2017-04-18: qty 1

## 2017-04-18 MED ORDER — IPRATROPIUM BROMIDE 0.02 % IN SOLN
0.5000 mg | Freq: Four times a day (QID) | RESPIRATORY_TRACT | Status: DC
  Administered 2017-04-18: 0.5 mg via RESPIRATORY_TRACT
  Filled 2017-04-18: qty 2.5

## 2017-04-18 MED ORDER — VANCOMYCIN HCL IN DEXTROSE 750-5 MG/150ML-% IV SOLN: 750.0000 mg | INTRAVENOUS | Status: DC

## 2017-04-18 MED ORDER — PANTOPRAZOLE SODIUM 40 MG IV SOLR
40.0000 mg | Freq: Two times a day (BID) | INTRAVENOUS | Status: DC
  Administered 2017-04-18: 40 mg via INTRAVENOUS
  Filled 2017-04-18: qty 40

## 2017-04-18 MED ORDER — ACETAMINOPHEN 325 MG PO TABS: 650.0000 mg | ORAL_TABLET | Freq: Four times a day (QID) | ORAL | Status: DC | PRN

## 2017-04-18 MED ORDER — ROSUVASTATIN CALCIUM 20 MG PO TABS: 20.0000 mg | ORAL_TABLET | Freq: Every evening | ORAL | Status: DC

## 2017-04-18 MED ORDER — SODIUM CHLORIDE 0.9% FLUSH: 3.0000 mL | INTRAVENOUS | Status: DC | PRN

## 2017-04-18 MED ORDER — SODIUM CHLORIDE 0.9% FLUSH: 3.0000 mL | Freq: Two times a day (BID) | INTRAVENOUS | Status: DC

## 2017-04-18 MED ORDER — LEVALBUTEROL HCL 0.63 MG/3ML IN NEBU: 0.6300 mg | INHALATION_SOLUTION | Freq: Four times a day (QID) | RESPIRATORY_TRACT | Status: DC | PRN

## 2017-04-18 MED ORDER — ONDANSETRON HCL 4 MG/2ML IJ SOLN: 4.0000 mg | Freq: Four times a day (QID) | INTRAMUSCULAR | Status: DC | PRN

## 2017-04-18 MED ORDER — CALCIUM ACETATE 667 MG PO CAPS: 667.0000 mg | ORAL_CAPSULE | ORAL | Status: DC

## 2017-04-18 MED ORDER — CALCIUM ACETATE (PHOS BINDER) 667 MG PO CAPS
1334.0000 mg | ORAL_CAPSULE | Freq: Three times a day (TID) | ORAL | Status: DC
  Filled 2017-04-18: qty 2

## 2017-04-18 MED ORDER — HYDROCODONE-ACETAMINOPHEN 5-325 MG PO TABS
1.0000 | ORAL_TABLET | ORAL | Status: DC | PRN
  Administered 2017-04-18: 2 via ORAL
  Filled 2017-04-18: qty 2

## 2017-04-18 MED ORDER — ACETAMINOPHEN 650 MG RE SUPP: 650.0000 mg | Freq: Four times a day (QID) | RECTAL | Status: DC | PRN

## 2017-04-18 MED ORDER — ONDANSETRON HCL 4 MG PO TABS
4.0000 mg | ORAL_TABLET | Freq: Four times a day (QID) | ORAL | Status: DC | PRN
Start: 2017-04-18 — End: 2017-04-18

## 2017-04-18 NOTE — Progress Notes (Signed)
Pharmacy Antibiotic Note  Clayton Porter is a 51 y.o. male admitted on 04/17/2017 with pneumonia.  Pharmacy has been consulted for Vancomycin/Cefepime dosing. WBC is elevated at 18.6. Pt has ESRD on HD TTS.   Plan: Vancomycin 1500 mg IV x 1 already given in ED, now give 750 mg IV qHD TTS Cefepime 2g IV x 1 already given in ED, now give Cefepime 2g at 1800 on HD days Trend WBC, temp, HD schedule F/U infectious work-up Drug levels as indicated   Height: 5\' 8"  (172.7 cm) Weight: 141 lb 12.1 oz (64.3 kg) IBW/kg (Calculated) : 68.4  Temp (24hrs), Avg:99.4 F (37.4 C), Min:99.4 F (37.4 C), Max:99.4 F (37.4 C)   Recent Labs Lab 04/17/17 1820 04/17/17 1859 04/17/17 2058  WBC 18.6*  --   --   CREATININE 2.78*  --   --   LATICACIDVEN  --  2.67* 2.31*    Estimated Creatinine Clearance: 28.3 mL/min (A) (by C-G formula based on SCr of 2.78 mg/dL (H)).    Allergies  Allergen Reactions  . No Known Allergies     Narda Bonds 04/18/2017 3:00 AM

## 2017-04-18 NOTE — Consult Note (Signed)
..   Name: Clayton Porter MRN: [id number] DOB: 12-05-1965    ADMISSION DATE:  04/17/2017 CONSULTATION DATE:  04/17/17  REFERRING MD :  [name] CHIEF COMPLAINT:  Hemoptysis  BRIEF PATIENT DESCRIPTION:50 yr old ESRD patient with PMHx significant for PVD, CVA, s/p splenectomy  SIGNIFICANT EVENTS  Episodes of hemoptysis  STUDIES:  CT CHEST CXR  HISTORY OF PRESENT ILLNESS:   51 yr old male with PMHx of PVD s/p stenting to LLE on Plavix, CVA, ESRD on scheduled HD TTS (no missed appts) previously on PD, s/p splenectomy for rupture,  states that within the last week he has had persistent cough where he produces blood he quantifies it a 3-4 tsp a day. He states that he has had increased dyspnea especially with exertion and chills. He also admits to weight loss but was unable to quantify how many lbs over what time for me.  no other positive symptoms, in particular He denies nosebleeds. The last time he remembers coughing up blood was as a child and has had no similar episodes since.   PAST MEDICAL HISTORY :   has a past medical history of Anxiety; Chronic kidney disease; Complication of anesthesia (2005 ish); Hearing loss of both ears; History of blood transfusion; Hyperglycemia; Hypertension; Insomnia; Peripheral vascular disease ([place]); Peritoneal dialysis catheter dysfunction ([place]); Splenic rupture; Stroke [facility]); and Substance abuse.  has a past surgical history that includes Peritoneal catheter insertion (04/28/2016); Cholecystectomy; Exteriorization of a continuous ambulatory peritoneal dialysis catheter; Eye surgery (Right); Hemorroidectomy; Tibialis tendon transfer / repair (Right); Iliac artery stent (Left); Splenectomy (05/08/2016); Hemodialysis catheter; and AV fistula placement (Left, 10/12/2016). Prior to Admission medications   Medication Sig Start Date End Date Taking? Authorizing Provider  aspirin EC 81 MG tablet Take 81 mg by mouth every evening.   Yes [provider]    calcium acetate (PHOSLO) 667 MG capsule Take 667-1,334 mg by mouth See admin instructions. Take 2 capsules (1334 mg) by mouth three times daily with meals and 1 capsule (667 mg) with snacks   Yes [provider]  Cholecalciferol (VITAMIN D PO) Take 500 Units by mouth 2 (two) times daily.    Yes [provider]  clopidogrel (PLAVIX) 75 MG tablet Take 75 mg by mouth every evening.    Yes [provider]  diphenhydramine-acetaminophen (TYLENOL PM) 25-500 MG TABS tablet Take 2 tablets by mouth at bedtime.    Yes [provider]  lidocaine-prilocaine (EMLA) cream Apply 1 application topically See admin instructions. Apply topically to access sites one hour before dialysis   Yes [provider]  losartan (COZAAR) 100 MG tablet Take 100 mg by mouth every evening.    Yes [provider]  rosuvastatin (CRESTOR) 20 MG tablet Take 20 mg by mouth every evening.    Yes [provider]  oxyCODONE-acetaminophen (PERCOCET/ROXICET) 5-325 MG tablet Take 1 tablet by mouth every 6 (six) hours as needed. Patient not taking: Reported on 11/27/2016 10/12/16   Virgina Jock A, PA-C   Allergies  Allergen Reactions  . No Known Allergies     FAMILY HISTORY:  family history is not on file. He was adopted. 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 In the last 3-4 months he has been exposed  to someone who was recently incarcerated (currently living at home) denies any h/o TB or PPD or prior testing. Isolated to his teenage years and twenties admits to ETOH use and illicit substances.   REVIEW OF SYSTEMS:  (a full 10 point review was conducted. bolded items= pertinent positives) Constitutional: Negative for fever,  chills, weight loss, malaise/fatigue and diaphoresis.  HENT: Negative for hearing loss, ear pain, nosebleeds, congestion, sore throat, neck pain, tinnitus and ear discharge.   Eyes: Negative for blurred vision, double vision, photophobia, pain, discharge and redness.  Respiratory: Negative for cough, hemoptysis, sputum production, shortness of breath, wheezing and stridor.   Cardiovascular: Negative for chest pain, palpitations, orthopnea, claudication, leg swelling and PND.  Gastrointestinal: Negative for heartburn, nausea, vomiting, diffuse infrequent abdominal pain, diarrhea, constipation, blood in stool and melena.  Genitourinary: Negative for dysuria, urgency, frequency, hematuria and flank pain.  Musculoskeletal: Negative for myalgias, back pain, joint pain and falls. clubbing of fingers Skin: Negative for itching and rash.  Neurological: Negative for dizziness, tingling, tremors, sensory change, speech change, focal weakness, seizures, loss of consciousness, weakness and headaches.  Endo/Heme/Allergies: Negative for environmental allergies and polydipsia. Does not bruise/bleed easily.   VITAL SIGNS: Temp:  [99.4 F (37.4 C)] 99.4 F (37.4 C) (08/04 1813) Pulse Rate:  [72-120] 106 (08/05 0145) Resp:  [13-26] 26 (08/04 2045) BP: (95-115)/(58-73) 105/73 (08/05 0145) SpO2:  [96 %-100 %] 100 % (08/05 0145) Weight:  [64.3 kg (141 lb 12.1 oz)] 64.3 kg (141 lb 12.1 oz) (08/05 0048)  .Physical Exam  Constitutional: He is oriented to person, place, and time.  Thin cachetic   HENT:  Head: Normocephalic and atraumatic.  Eyes: Pupils are equal, round, and reactive to light. Conjunctivae and EOM are normal.  Neck: Normal range of motion. Neck supple. No JVD present.  Cardiovascular: Regular rhythm and normal heart sounds.   tachycardic  Pulmonary/Chest: Effort normal and breath sounds normal. No stridor. No respiratory distress. He has no rales. He exhibits no tenderness.  No egophony  present on exam. Decreased breath sounds at left lower base. Some exp wheeze on deep expiration L>R  Abdominal: Soft. Bowel sounds are normal. He exhibits no distension. There is no tenderness. There is no guarding.  Musculoskeletal: Normal range of motion.  Noted clubbing of the fingertips  Lymphadenopathy:    He has no cervical adenopathy.  Neurological: He is alert and oriented to person, place, and time.  Skin: Skin is warm. Capillary refill takes 2 to 3 seconds.     Recent Labs Lab 04/17/17 1820  NA 133*  K 3.4*  CL 90*  CO2 30  BUN 10  CREATININE 2.78*  GLUCOSE 133*    Recent Labs Lab 04/17/17 1820  HGB 8.8*  HCT 26.6*  WBC 18.6*  PLT 655*   Dg Chest 2 View  Result Date: 04/17/2017 CLINICAL DATA:  Pt reports coughing up blood for several days. Having abd pain and reports vomiting blood last night. Dialysis pt, last treatment was today. EXAM: CHEST  2 VIEW COMPARISON:  05/14/2016 FINDINGS: The heart size is normal. There is patchy density within the left lower lobe, partially obscuring the medial hemidiaphragm. No pulmonary edema. There is perihilar peribronchial thickening. IMPRESSION: Left lower lobe opacity. Consider further evaluation with CT of the chest for evaluation of hemoptysis. Intravenous contrast would be helpful unless it is contraindicated. Electronically Signed   By: Nolon Nations M.D.   On: 04/17/2017 18:42   Ct Chest Wo Contrast  Result Date:  04/17/2017 CLINICAL DATA:  Hemoptysis for several days. History of end-stage renal disease on dialysis, substance abuse, hypertension. EXAM: CT CHEST WITHOUT CONTRAST TECHNIQUE: Multidetector CT imaging of the chest was performed following the standard protocol without IV contrast. COMPARISON:  CT chest May 13, 2016 and chest radiograph April 17, 2017 1831 hours FINDINGS: CARDIOVASCULAR: Heart size is normal. Trace pericardial effusion. Moderate coronary artery calcifications. Thoracic aorta is normal course and  caliber with mild calcific atherosclerosis. MEDIASTINUM/NODES: No mediastinal mass. No lymphadenopathy by CT size criteria. Normal appearance of thoracic esophagus though not tailored for evaluation. LUNGS/PLEURA: 5.2 x 7.1 cm LEFT perihilar masslike consolidation narrowing the lingular bronchus and invading the LEFT lower lobe bronchus. Patchy consolidation with tree-in-bud infiltrates LEFT lower lobe. Small LEFT pleural effusion. Mild biapical bullous changes. UPPER ABDOMEN: 4.6 x 5.9 cm LEFT and 4.9 x 7.1 cm RIGHT new adrenal masses. Status post splenectomy. Stable 3.6 cm infrarenal aortic aneurysm. MUSCULOSKELETAL: Included soft tissues and included osseous structures appear normal. IMPRESSION: 1. LEFT perihilar 5 x 8 x 7.1 cm consolidation highly concerning for neoplasm with endobronchial invasion. Additional LEFT lower lobe tree-in-bud infiltrates can be seen with postobstructive pneumonia or, lymphangitic spread of tumor. 2. New bilateral adrenal masses highly concerning for metastasis. For constellation of findings, recommend PET-CT. 3. Re- demonstration 3.6 cm infrarenal aortic aneurysm. Acute findings discussed with and reconfirmed by Dr.ERIN SCHLOSSMAN on 04/17/2017 at 8:43 pm. Aortic Atherosclerosis (ICD10-I70.0). Electronically Signed   By: Elon Alas M.D.   On: 04/17/2017 20:44     ASSESSMENT / PLAN:  51 yr old gentleman with PMHx significant for ESRD on HD TTS, 36 pack year history, s/p splenectomy admitted for persistent cough with hemoptysis.  PCCM consulted for 5.2 x 7.1 cm LEFT perihilar masslike consolidation narrowing the lingular bronchus and invading the LEFT lower lobe bronchus creating a postobstructive pneumonia  Active Issues:  New Lung mass in an active smoker Post obstructive pneumonia ? Adrenal masses - possible metastasis  DDX: Lung Cancer ( SCLC vs NSCLC) Less likely : Splenosis of lung  Recommendations:  Given patient's medical history( weight loss,  hemoptysis, chills), exposures ( 36 pack yrs),  physical exam (clubbing, decreased BS on Left lower lung field), labs (reactive thromobocytosis)  Collectively highly suspicious for possible Lung carcinoma Will need scheduled Bronchoscopy for diagnosis and to evaluate possible endobronchial lesion vs external compressive lesion creating post obstructive pneumonia.  Also on differential (rare) - ? Splenosis of the lung After traumatic splenic injury or splenectomy, small isolated spleens may develop A Technetium-67m (Tc-78m) scan should be considered if workup for malignancy is negative.   Signed Dr Seward [facility] .. 04/18/2017, 2:09 AM

## 2017-04-18 NOTE — ED Notes (Signed)
Pt refused blood draw stated not to go in hand.  I found vein in lower part of arm and pt stated that area was tender.  Pt appeared to become irritated,  No blood draw at this time and nurse was notified.a

## 2017-04-18 NOTE — ED Notes (Signed)
Kim MD was called and notified of patients request for something to eat. MD stated to hold with the NPO order. Pt was told of news and is unhappy and aggravated. He is threatening to leave AMA

## 2017-04-18 NOTE — ED Notes (Addendum)
Pt was talked to again about the NPO status. He became aggrevated and stated that he wanted to leave. RN explained why the patient should stay and stated that if a procedure doesn't happen he would be fed. MD Kim (floor coverage). Pt was firm with his decision about leaving. RN attempted to get patient to sign AMA forms bu he refused. IV and monitor was removed and Charge RN was notified

## 2017-04-18 NOTE — ED Notes (Signed)
Blood collected by RN Karl Ito

## 2017-04-18 NOTE — ED Notes (Signed)
Consulting MD at bedside

## 2017-04-22 LAB — CULTURE, BLOOD (ROUTINE X 2)
Culture: NO GROWTH
Culture: NO GROWTH
SPECIAL REQUESTS: ADEQUATE
Special Requests: ADEQUATE

## 2017-05-03 DIAGNOSIS — R918 Other nonspecific abnormal finding of lung field: Secondary | ICD-10-CM

## 2017-05-26 DIAGNOSIS — C3432 Malignant neoplasm of lower lobe, left bronchus or lung: Secondary | ICD-10-CM | POA: Diagnosis not present

## 2017-05-26 DIAGNOSIS — N186 End stage renal disease: Secondary | ICD-10-CM | POA: Diagnosis not present

## 2017-05-26 DIAGNOSIS — D696 Thrombocytopenia, unspecified: Secondary | ICD-10-CM

## 2017-05-26 DIAGNOSIS — M899 Disorder of bone, unspecified: Secondary | ICD-10-CM

## 2017-05-26 DIAGNOSIS — C7971 Secondary malignant neoplasm of right adrenal gland: Secondary | ICD-10-CM | POA: Diagnosis not present

## 2017-05-26 DIAGNOSIS — Z992 Dependence on renal dialysis: Secondary | ICD-10-CM

## 2017-05-26 DIAGNOSIS — Z8673 Personal history of transient ischemic attack (TIA), and cerebral infarction without residual deficits: Secondary | ICD-10-CM

## 2017-05-26 DIAGNOSIS — D72829 Elevated white blood cell count, unspecified: Secondary | ICD-10-CM

## 2017-05-26 DIAGNOSIS — J188 Other pneumonia, unspecified organism: Secondary | ICD-10-CM | POA: Diagnosis not present

## 2017-05-26 DIAGNOSIS — D631 Anemia in chronic kidney disease: Secondary | ICD-10-CM | POA: Diagnosis not present

## 2017-05-26 DIAGNOSIS — C7972 Secondary malignant neoplasm of left adrenal gland: Secondary | ICD-10-CM

## 2017-07-15 DEATH — deceased

## 2018-08-12 IMAGING — CT CT CHEST W/O CM
2 of 3 series · 15 of 36 positions shown, 18 images · non-contrast
Comparison: CT chest May 13, 2016 and chest radiograph [DATE],
3916 1671 hours

CLINICAL DATA: Hemoptysis for several days. History of end-stage
renal disease on dialysis, substance abuse, hypertension.

EXAM:
CT CHEST WITHOUT CONTRAST
TECHNIQUE: Multidetector CT imaging of the chest was performed following the
standard protocol without IV contrast.

[Series 3: chest w/o 2mm st · axial · non-contrast · 0.78mm/px · z∈[+1135,+1423]mm · 12 of 170 slices shown, 15 images]
[im 13/170  mediastinal]
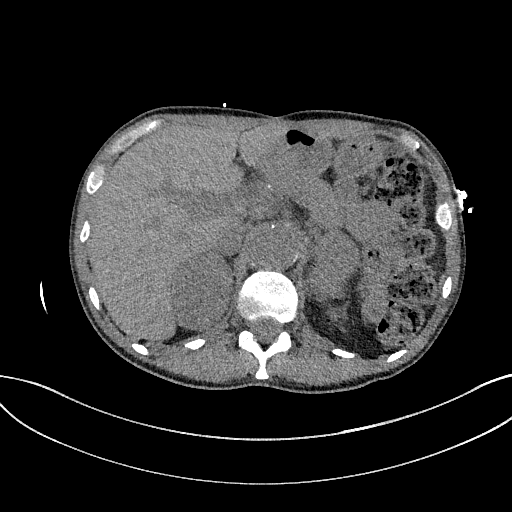
[im 13/170  lung]
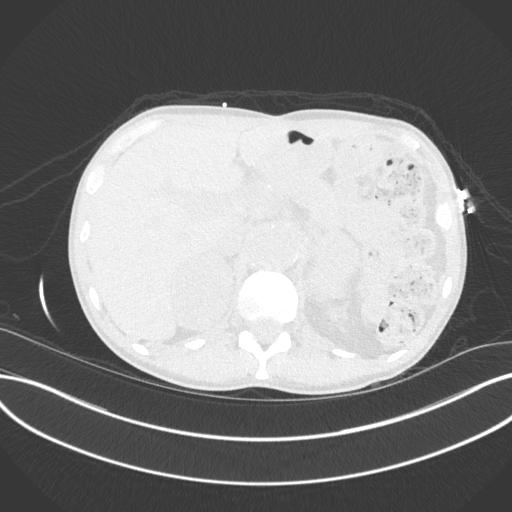
[im 26/170  lung]
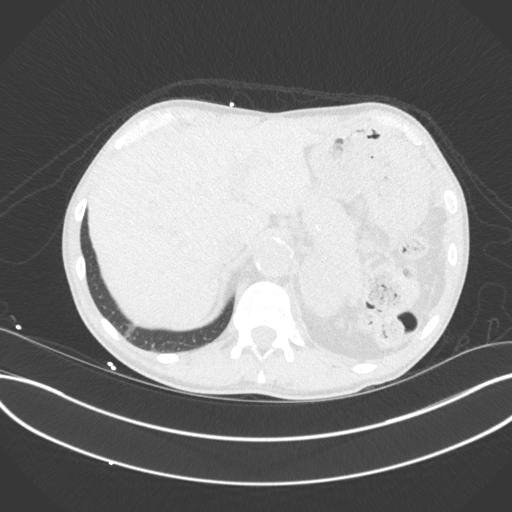
[im 38/170  lung]
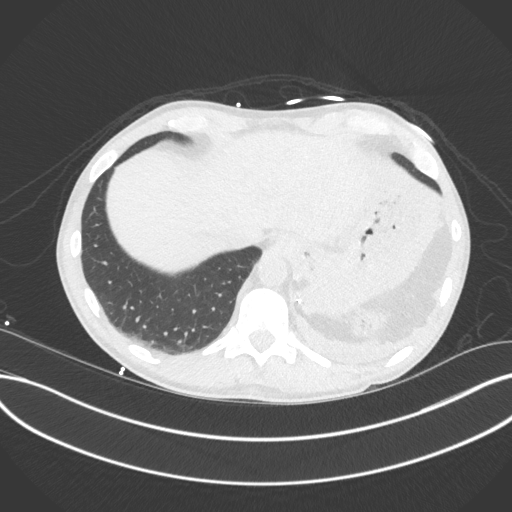
[im 51/170  lung]
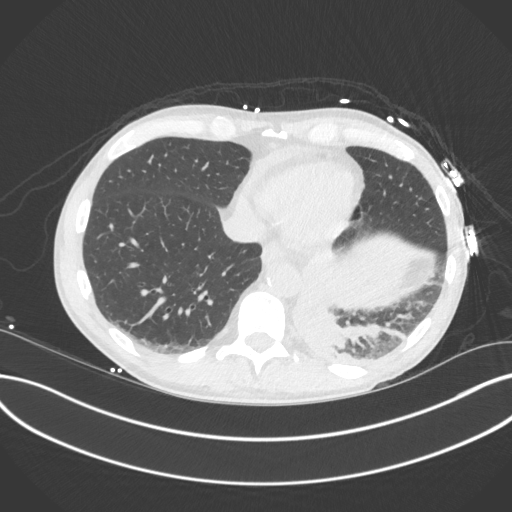
[im 63/170  mediastinal]
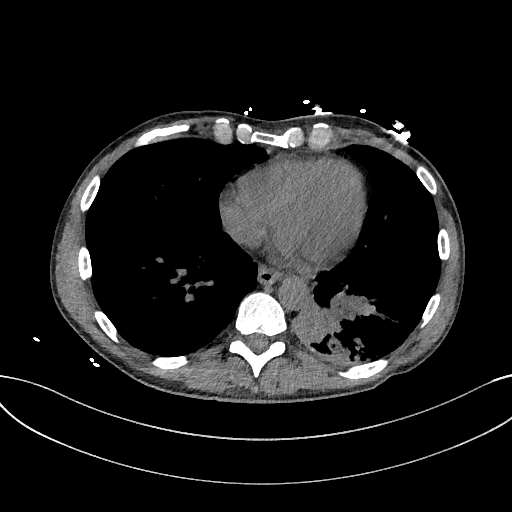
[im 63/170  lung]
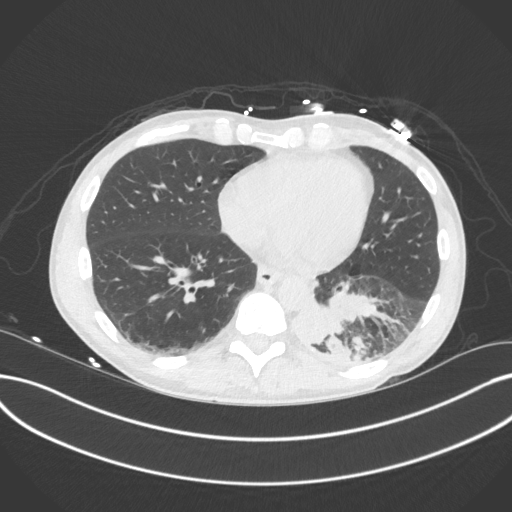
[im 76/170  lung]
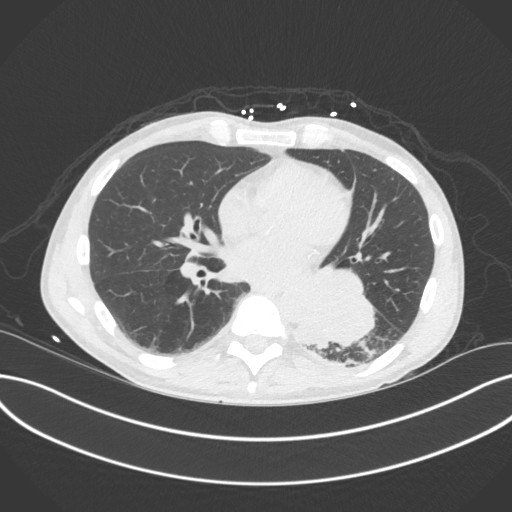
[im 94/170  lung]
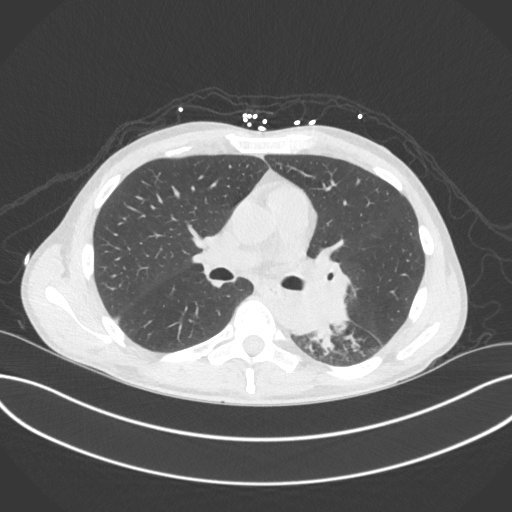
[im 107/170  lung]
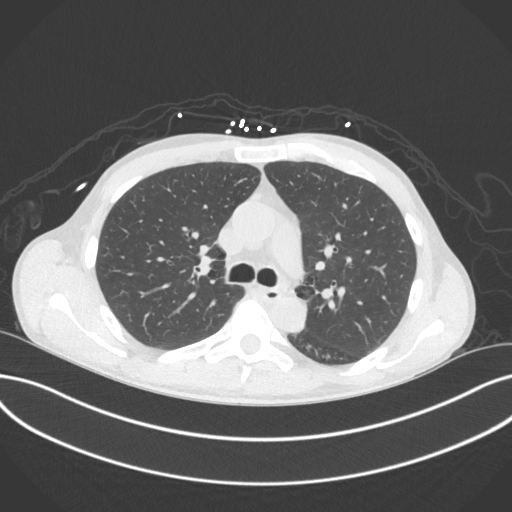
[im 119/170  mediastinal]
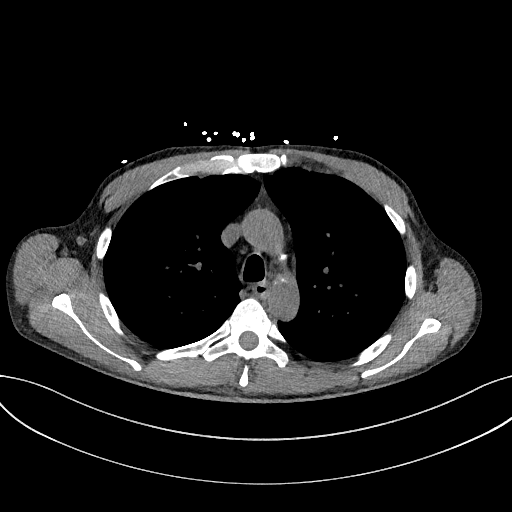
[im 119/170  lung]
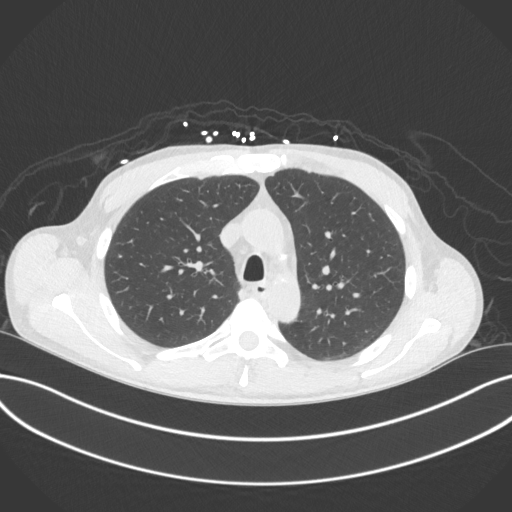
[im 132/170  lung]
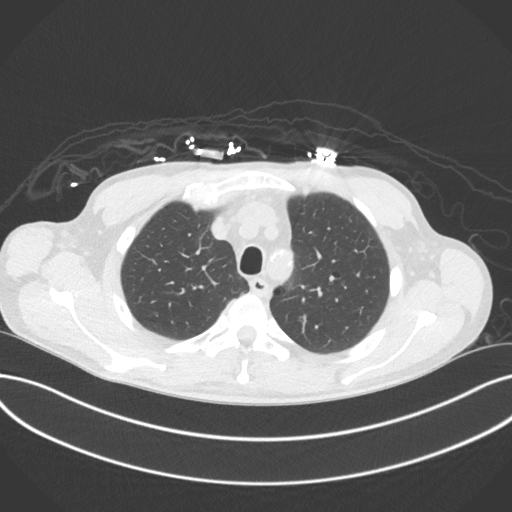
[im 144/170  lung]
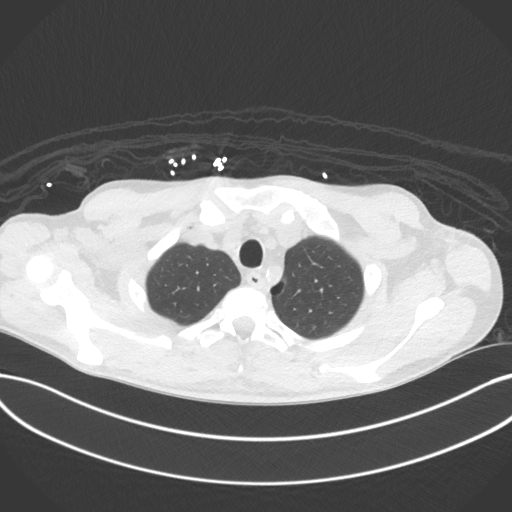
[im 157/170  lung]
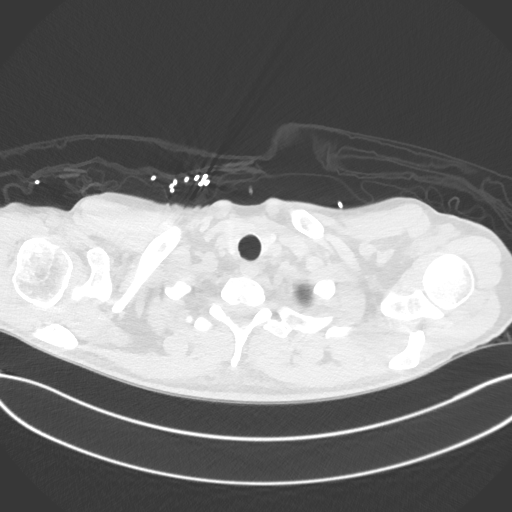

[Series 5: chest w/o 3mm st cor · coronal · non-contrast · 0.67mm/px · 3 of 101 slices shown]
[im 21/101  lung]
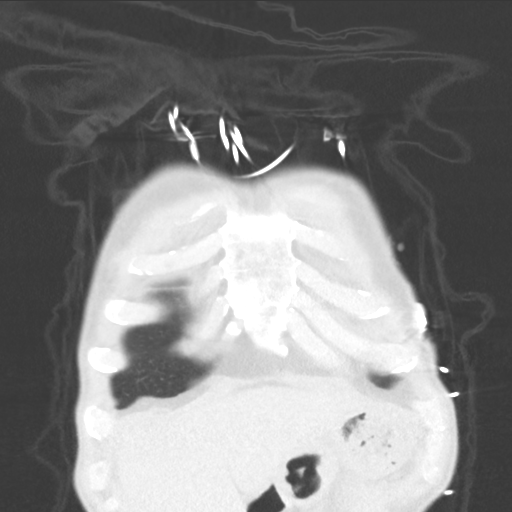
[im 41/101  lung]
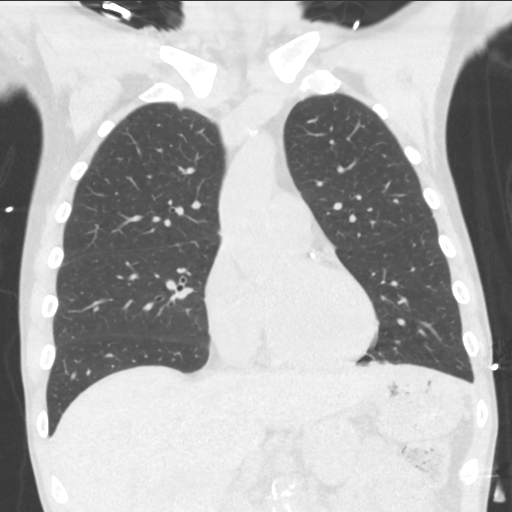
[im 61/101  lung]
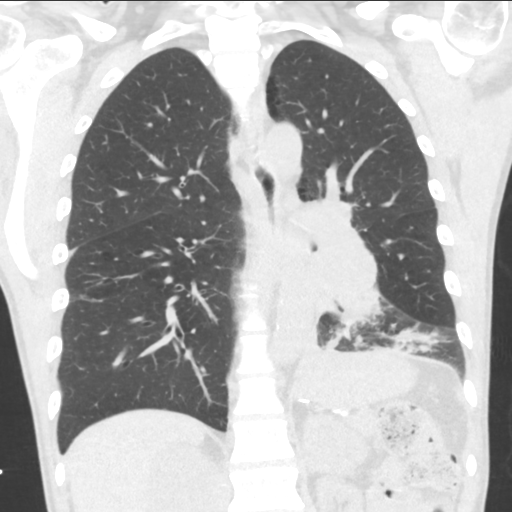

[15 of 36 positions shown; findings below may reference images not displayed]

FINDINGS: CARDIOVASCULAR: Heart size is normal. Trace pericardial effusion.
Moderate coronary artery calcifications. Thoracic aorta is normal
course and caliber with mild calcific atherosclerosis.

MEDIASTINUM/NODES: No mediastinal mass. No lymphadenopathy by CT
size criteria. Normal appearance of thoracic esophagus though not
tailored for evaluation.

LUNGS/PLEURA: 5.2 x 7.1 cm LEFT perihilar masslike consolidation
narrowing the lingular bronchus and invading the LEFT lower lobe
bronchus. Patchy consolidation with tree-in-bud infiltrates LEFT
lower lobe. Small LEFT pleural effusion. Mild biapical bullous
changes.

UPPER ABDOMEN: [DATE] x 5.9 cm LEFT and 4.9 x 7.1 cm RIGHT new adrenal
masses. Status post splenectomy. Stable 3.6 cm infrarenal aortic
aneurysm.

MUSCULOSKELETAL: Included soft tissues and included osseous
structures appear normal.
IMPRESSION: 1. LEFT perihilar 5 x 8 x 7.1 cm consolidation highly concerning for
neoplasm with endobronchial invasion. Additional LEFT lower lobe
tree-in-bud infiltrates can be seen with postobstructive pneumonia
or, lymphangitic spread of tumor.
2. New bilateral adrenal masses highly concerning for metastasis.
For constellation of findings, recommend PET-CT.
3. Re- demonstration 3.6 cm infrarenal aortic aneurysm.
Acute findings discussed with and reconfirmed by Dr.NOLA OXENDINE
on 04/17/2017 at [DATE].

Aortic Atherosclerosis ([id number]).
